# Patient Record
Sex: Female | Born: 1980 | Race: White | Hispanic: No | Marital: Married | Smoking: Former smoker
Health system: Southern US, Community
[De-identification: ages and names within clinical notes are randomized; demographics above are authoritative.]

## PROBLEM LIST (undated history)

## (undated) DIAGNOSIS — J309 Allergic rhinitis, unspecified: Secondary | ICD-10-CM

## (undated) DIAGNOSIS — J45909 Unspecified asthma, uncomplicated: Secondary | ICD-10-CM

## (undated) DIAGNOSIS — K219 Gastro-esophageal reflux disease without esophagitis: Secondary | ICD-10-CM

## (undated) HISTORY — PX: COLONOSCOPY: SHX174

## (undated) HISTORY — DX: Unspecified asthma, uncomplicated: J45.909

## (undated) HISTORY — PX: SINOSCOPY: SHX187

---

## 1999-03-22 ENCOUNTER — Other Ambulatory Visit: Admission: RE | Admit: 1999-03-22 | Discharge: 1999-03-22 | Payer: Self-pay | Admitting: Family Medicine

## 2000-04-02 ENCOUNTER — Other Ambulatory Visit: Admission: RE | Admit: 2000-04-02 | Discharge: 2000-04-02 | Payer: Self-pay | Admitting: Family Medicine

## 2001-04-09 ENCOUNTER — Other Ambulatory Visit: Admission: RE | Admit: 2001-04-09 | Discharge: 2001-04-09 | Payer: Self-pay | Admitting: Family Medicine

## 2002-04-22 ENCOUNTER — Other Ambulatory Visit: Admission: RE | Admit: 2002-04-22 | Discharge: 2002-04-22 | Payer: Self-pay | Admitting: Family Medicine

## 2004-04-29 ENCOUNTER — Ambulatory Visit: Payer: Self-pay | Admitting: Family Medicine

## 2004-07-06 ENCOUNTER — Ambulatory Visit: Payer: Self-pay | Admitting: Family Medicine

## 2004-12-13 ENCOUNTER — Other Ambulatory Visit: Admission: RE | Admit: 2004-12-13 | Discharge: 2004-12-13 | Payer: Self-pay | Admitting: Obstetrics and Gynecology

## 2005-02-08 ENCOUNTER — Inpatient Hospital Stay (HOSPITAL_COMMUNITY): Admission: AD | Admit: 2005-02-08 | Discharge: 2005-02-08 | Payer: Self-pay | Admitting: Obstetrics and Gynecology

## 2005-02-13 ENCOUNTER — Inpatient Hospital Stay (HOSPITAL_COMMUNITY): Admission: AD | Admit: 2005-02-13 | Discharge: 2005-02-13 | Payer: Self-pay | Admitting: Obstetrics and Gynecology

## 2005-02-16 ENCOUNTER — Inpatient Hospital Stay (HOSPITAL_COMMUNITY): Admission: AD | Admit: 2005-02-16 | Discharge: 2005-02-18 | Payer: Self-pay | Admitting: Obstetrics and Gynecology

## 2005-03-23 ENCOUNTER — Other Ambulatory Visit: Admission: RE | Admit: 2005-03-23 | Discharge: 2005-03-23 | Payer: Self-pay | Admitting: Obstetrics and Gynecology

## 2007-06-06 ENCOUNTER — Inpatient Hospital Stay (HOSPITAL_COMMUNITY): Admission: AD | Admit: 2007-06-06 | Discharge: 2007-06-09 | Payer: Self-pay | Admitting: Obstetrics and Gynecology

## 2007-06-06 ENCOUNTER — Inpatient Hospital Stay (HOSPITAL_COMMUNITY): Admission: AD | Admit: 2007-06-06 | Discharge: 2007-06-06 | Payer: Self-pay | Admitting: Obstetrics and Gynecology

## 2007-07-22 IMAGING — US US FETAL BPP W/O NONSTRESS
1 series · 18 of 19 positions shown · non-contrast
Comparison: none

CLINICAL DATA: 36 week 3 day assigned gestational age.  Pregnancy induced hypertension.  Nausea and vomiting.  Evaluate biophysical profile.

[Series 1: us fetal bpp w/o nonstress · non-contrast · 18 of 19 slices shown]
[im 1/19]
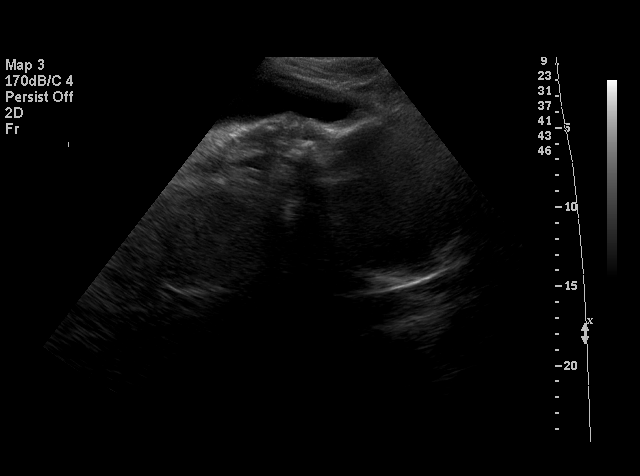
[im 2/19]
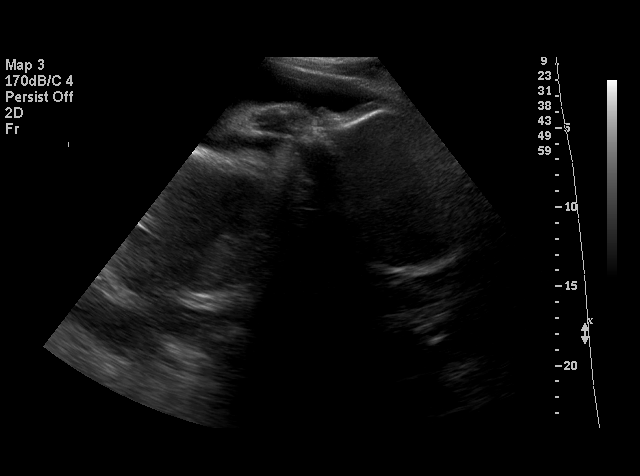
[im 3/19]
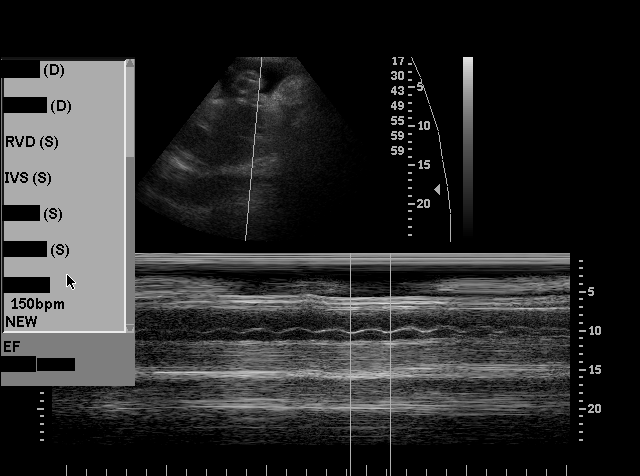
[im 4/19]
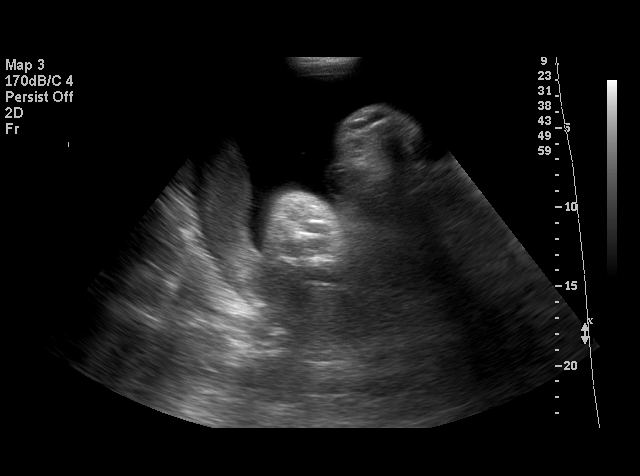
[im 5/19]
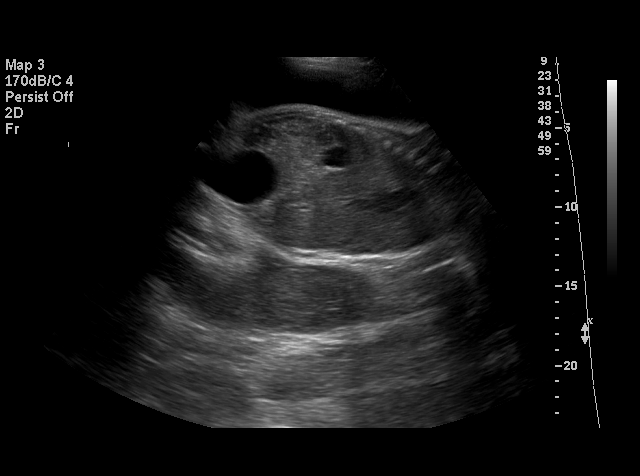
[im 6/19]
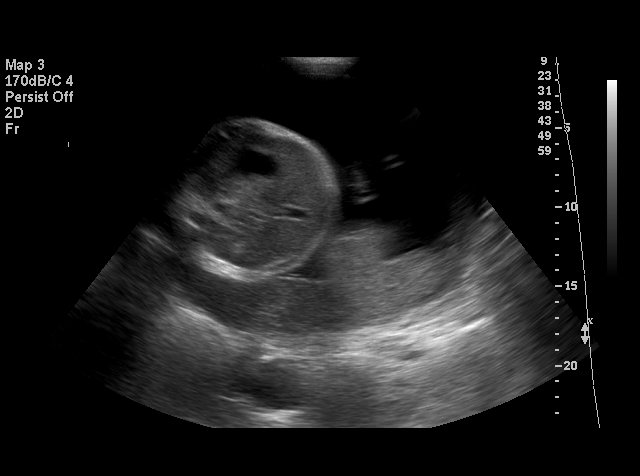
[im 7/19]
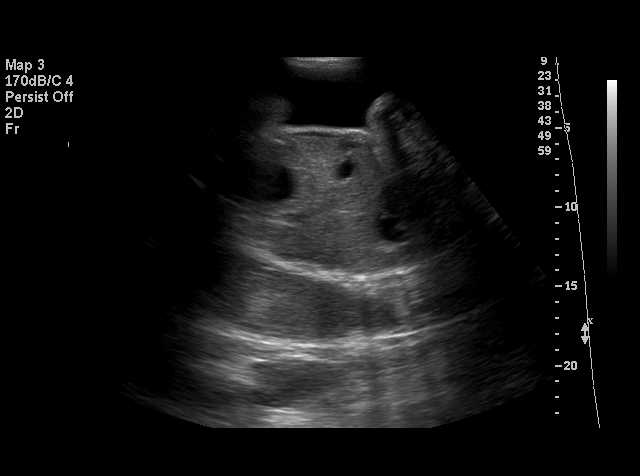
[im 8/19]
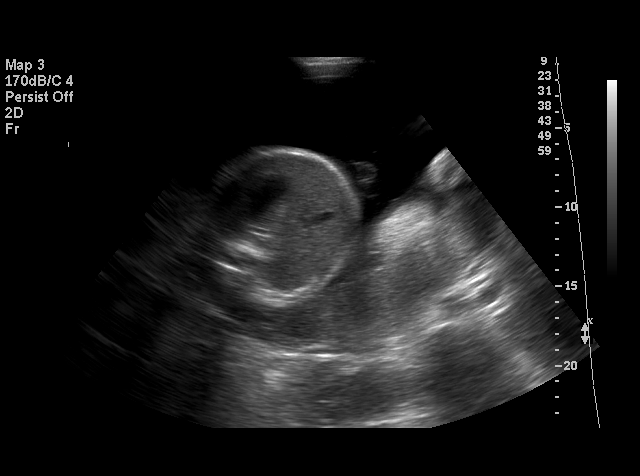
[im 9/19]
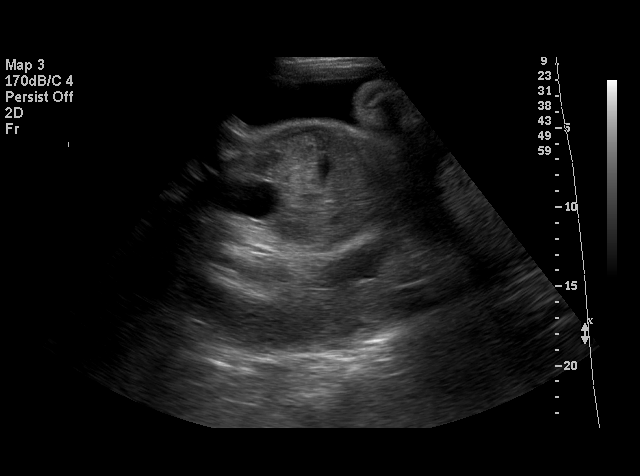
[im 11/19]
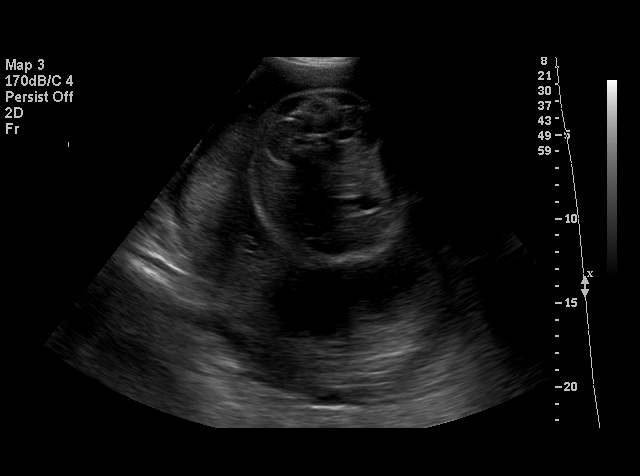
[im 12/19]
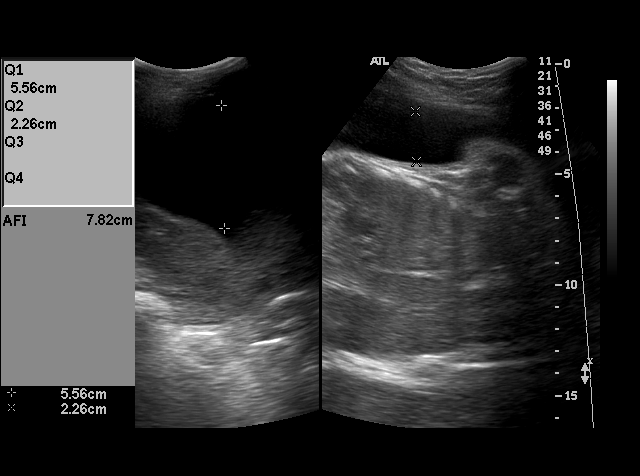
[im 13/19]
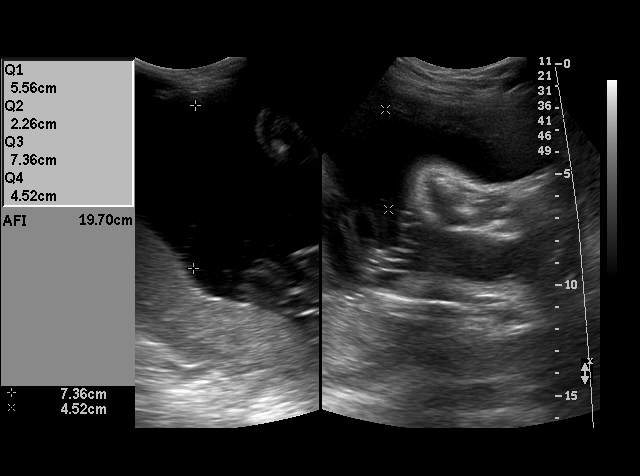
[im 14/19]
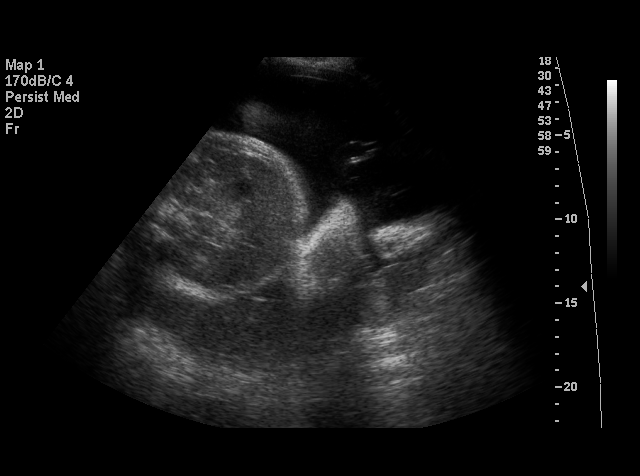
[im 15/19]
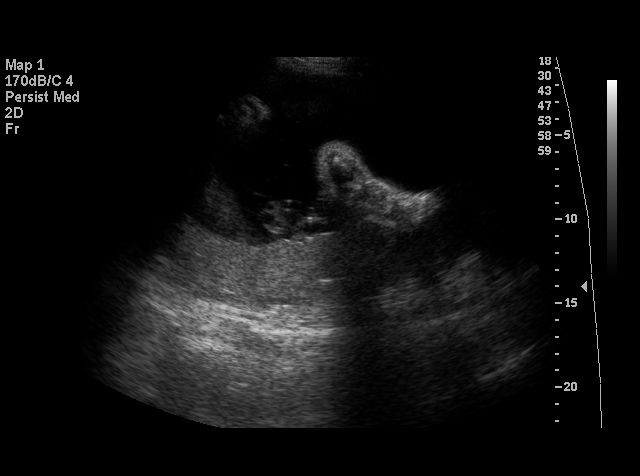
[im 16/19]
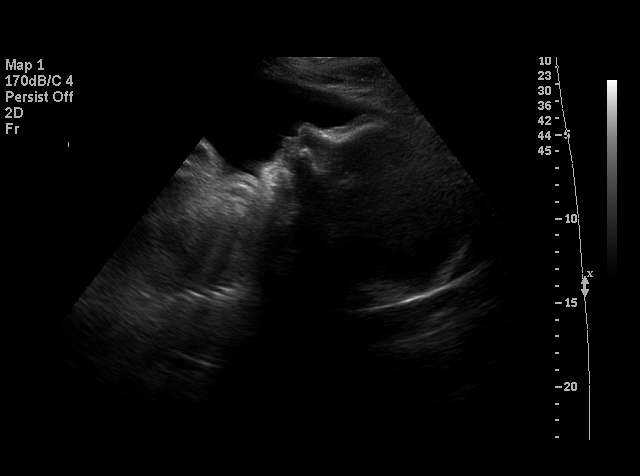
[im 17/19]
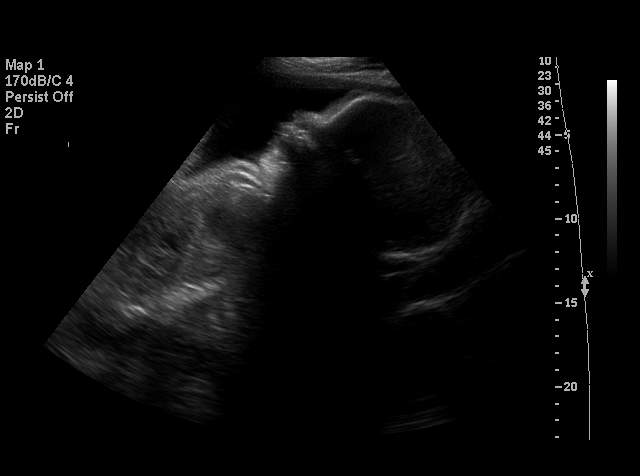
[im 18/19]
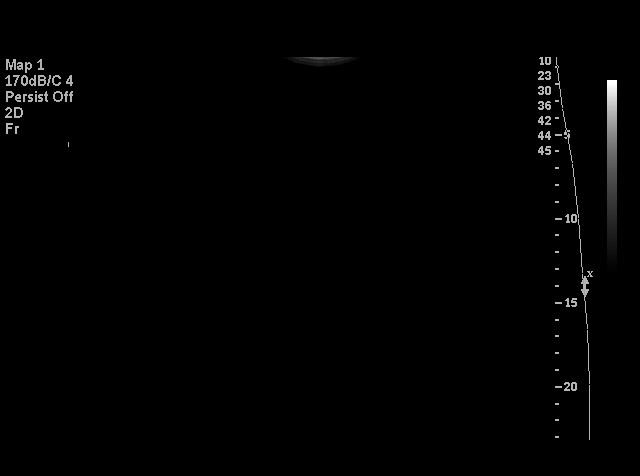
[im 19/19]
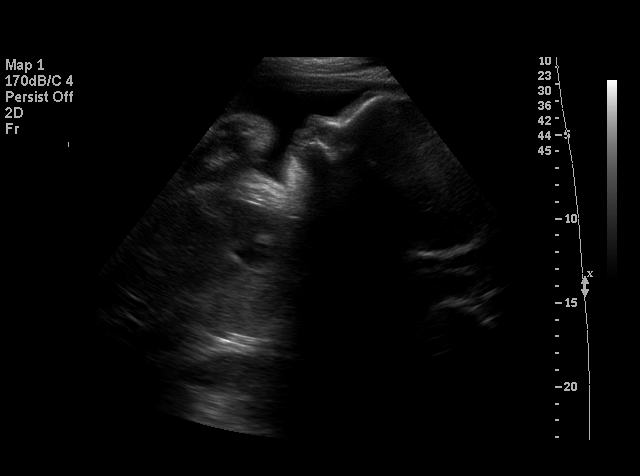

[18 of 19 positions shown; findings below may reference images not displayed]

BIOPHYSICAL PROFILE

 Number of Fetuses:  1
 Heart rate: 150
 Movement:  Yes
 Breathing:  Yes
 Presentation:  Cephalic
 Placental Location:  Posterior
 Grade:  I
 Previa:  No
 Amniotic Fluid (Subjective):  Upper normal
 Amniotic Fluid (Objective):  19.7 cm AFI (5th -95th%ile = 7.7 ? 24.9 cm for 36 wks)

 Fetal measurements and complete anatomic evaluation were not requested.  The following fetal anatomy was visualized on this exam: Stomach, kidneys, and bladder.

 BPP SCORING
 Movements:  2  Time:  6 minutes
 Breathing:  2 
 Tone:  2
 Amniotic Fluid:  2
 Total Score:  8

 MATERNAL UTERINE AND ADNEXAL FINDINGS
 Cervix:  Not evaluated
IMPRESSION: Single living intrauterine fetus in cephalic presentation.  Biophysical profile score [DATE].

## 2010-12-05 LAB — CBC
HCT: 39
HCT: 41.6
Hemoglobin: 13.6
Hemoglobin: 14.5
MCHC: 34.9
MCHC: 35
MCV: 91.8
MCV: 92.4
Platelets: 250
Platelets: 300
RBC: 4.25
RBC: 4.5
RDW: 12.6
RDW: 13.1
WBC: 11.9 — ABNORMAL HIGH
WBC: 12.2 — ABNORMAL HIGH

## 2010-12-05 LAB — RPR: RPR Ser Ql: NONREACTIVE

## 2014-11-19 DIAGNOSIS — J45909 Unspecified asthma, uncomplicated: Secondary | ICD-10-CM | POA: Insufficient documentation

## 2014-11-19 DIAGNOSIS — J309 Allergic rhinitis, unspecified: Secondary | ICD-10-CM | POA: Insufficient documentation

## 2014-11-19 DIAGNOSIS — K219 Gastro-esophageal reflux disease without esophagitis: Secondary | ICD-10-CM | POA: Insufficient documentation

## 2015-02-03 ENCOUNTER — Encounter: Payer: Self-pay | Admitting: Allergy and Immunology

## 2015-02-03 ENCOUNTER — Ambulatory Visit (INDEPENDENT_AMBULATORY_CARE_PROVIDER_SITE_OTHER): Payer: PRIVATE HEALTH INSURANCE | Admitting: Allergy and Immunology

## 2015-02-03 VITALS — BP 120/82 | HR 100 | Resp 16 | Ht 62.21 in | Wt 142.2 lb

## 2015-02-03 DIAGNOSIS — J4551 Severe persistent asthma with (acute) exacerbation: Secondary | ICD-10-CM | POA: Diagnosis not present

## 2015-02-03 DIAGNOSIS — J3089 Other allergic rhinitis: Secondary | ICD-10-CM

## 2015-02-03 DIAGNOSIS — K219 Gastro-esophageal reflux disease without esophagitis: Secondary | ICD-10-CM

## 2015-02-03 NOTE — Progress Notes (Signed)
[facility]  Follow-up Note  Refering Provider: No ref. provider found Primary Provider: Madaline Guthrie  Subjective:   Leslie Wheeler is a 34 y.o. female who [facility] the [facility] in re-evaluation of the following:  HPI Comments:  Leslie Wheeler returns to this clinic on 02/03/2015 in evaluation of her asthma and allergic rhinitis and reflux. She is really been doing very well up until possibly 3 weeks ago. That point time she started to develop problems with cough. As well, the course the past week she's developed stuffy nose and pressure and some green nasal discharge and ear pressure and she's using some over-the-counter phenylephrine. It should be noted that she was doing quite well and she felt that she no longer needed to use either Qvar or Advair. She did have instructions to slowly taper off to Qvar and remain on Advair but for some reason Advair was also discontinued. She has had use of bronchodilator recently. Her reflux is okay as long she remains away from all caffeine though she does have what sounds like a cup of tea in the morning and as long she continues to use her omeprazole 20 milk grams twice a day. Her nose is not really been causing her any problem. She consisted uses montelukast and Flonase.   Outpatient Encounter Prescriptions as of 02/03/2015  Medication Sig  . albuterol (PROVENTIL HFA;VENTOLIN HFA) 108 (90 BASE) MCG/ACT inhaler Inhale 2 puffs into the lungs every 6 (six) hours as needed for wheezing or shortness of breath.  . fluticasone (FLONASE) 50 MCG/ACT nasal spray Place 1 spray into both nostrils 2 (two) times daily.  Marland Kitchen loratadine (CLARITIN) 10 MG tablet Take 10 mg by mouth daily as needed for allergies.  . montelukast (SINGULAIR) 10 MG tablet Take 10 mg by mouth daily.  Marland Kitchen omeprazole (PRILOSEC) 20 MG capsule Take 20 mg by mouth daily.  . beclomethasone (QVAR) 80 MCG/ACT inhaler Inhale 2  puffs into the lungs 2 (two) times daily.  . fluticasone-salmeterol (ADVAIR HFA) 230-21 MCG/ACT inhaler Inhale 2 puffs into the lungs 2 (two) times daily.   No facility-administered encounter medications on file as of 02/03/2015.    No orders of the defined types were placed in this encounter.    Past Medical History  Diagnosis Date  . Asthma     Past Surgical History  Procedure Laterality Date  . Sinoscopy      Allergies  Allergen Reactions  . Aspirin   . Bactrim [Sulfamethoxazole-Trimethoprim]   . Cephalosporins   . Flagyl [Metronidazole]   . Latex   . Nsaids   . Penicillins   . Sulfa Antibiotics     Review of Systems  Constitutional: Negative.   HENT: Positive for congestion, sinus pressure and sneezing.   Respiratory: Positive for choking and wheezing.   Cardiovascular: Negative.   Gastrointestinal: Negative.   Musculoskeletal: Negative.   Skin: Negative.      Objective:   Filed Vitals:   02/03/15 1636  BP: 120/82  Pulse: 100  Resp: 16   Height: 5' 2.21" (158 cm)  Weight: 142 lb 3.2 oz (64.5 kg)   Physical Exam  Constitutional: She appears well-developed and well-nourished. No distress.  Nasal voice and slight cough  HENT:  Head: Normocephalic and atraumatic. Head is without right periorbital erythema and without left periorbital erythema.  Right Ear: Tympanic membrane, external ear and ear canal normal. No drainage or tenderness. No foreign bodies. Tympanic  membrane is not injected, not scarred, not perforated, not erythematous, not retracted and not bulging. No middle ear effusion.  Left Ear: Tympanic membrane, external ear and ear canal normal. No drainage or tenderness. No foreign bodies. Tympanic membrane is not injected, not scarred, not perforated, not erythematous, not retracted and not bulging.  No middle ear effusion.  Nose: Mucosal edema present. No rhinorrhea, nose lacerations or sinus tenderness.  No foreign bodies.  Mouth/Throat:  Oropharynx is clear and moist. No oropharyngeal exudate, posterior oropharyngeal edema, posterior oropharyngeal erythema or tonsillar abscesses.  Eyes: Lids are normal. Right eye exhibits no chemosis, no discharge and no exudate. No foreign body present in the right eye. Left eye exhibits no chemosis, no discharge and no exudate. No foreign body present in the left eye. Right conjunctiva is not injected. Left conjunctiva is not injected.  Neck: Neck supple. No tracheal tenderness present. No tracheal deviation and no edema present. No thyroid mass and no thyromegaly present.  Cardiovascular: Normal rate, regular rhythm, S1 normal and S2 normal.  Exam reveals no gallop.   No murmur heard. Pulmonary/Chest: No accessory muscle usage or stridor. No respiratory distress. She has no wheezes. She has no rhonchi. She has no rales.  Abdominal: Soft.  Lymphadenopathy:       Head (right side): No tonsillar adenopathy present.       Head (left side): No tonsillar adenopathy present.    She has no cervical adenopathy.  Neurological: She is alert.  Skin: No rash noted. She is not diaphoretic.  Psychiatric: She has a normal mood and affect. Her behavior is normal.    Diagnostics:    Spirometry was performed and demonstrated an FEV1 of 2.96 at 102 % of predicted.  The patient had an Asthma Control Test with the following results: ACT Total Score: 17.    Assessment and Plan:   1. Asthma, severe persistent, with acute exacerbation   2. Other allergic rhinitis   3. Gastroesophageal reflux disease, esophagitis presence not specified      1. Advair 230 two inhalations two times a day   2. Add Qvar 80 2 inhalations twice a day to Advair during "flareup"  3. Continue montelukast 10 mg daily  4. Continue Flonase one or 2 sprays each nostril daily  5. Continue ProAir HFA and Claritin if needed  6. Continue omeprazole 20 mg twice a day  7. Use nasal saline multiple times a day while sick  8.  Antibiotics? - Maybe next week  9. Return to clinic in 3 months or earlier if problem.  I'm going to have carried consistently use Advair 2:30 and we can restart that today and she can always adding Qvar 80 during a flareup. I will assume that she has a viral respiratory tract infection and hold off on any antibiotics at this point in time but certainly she remains symptomatic as we enter into next week she can contact me for further evaluation and treatment. If she does well see her back in this clinic in 3 months or earlier if there is a problem.   Laurette SchimkeEric Kozlow, MD [facility]

## 2015-02-03 NOTE — Patient Instructions (Signed)
  1. Advair 230 two inhalations two times a day   2. Add Qvar 80 2 inhalations twice a day to Advair during "flareup"  3. Continue montelukast 10 mg daily  4. Continue Flonase one or 2 sprays each nostril daily  5. Continue ProAir HFA and Claritin if needed  6. Continue omeprazole 20 mg twice a day  7. Use nasal saline multiple times a day while sick  8. Antibiotics? - Maybe next week  9. Return to clinic in 3 months or earlier if problem.

## 2015-04-16 ENCOUNTER — Other Ambulatory Visit: Payer: Self-pay

## 2015-04-16 MED ORDER — MONTELUKAST SODIUM 10 MG PO TABS: 10.0000 mg | ORAL_TABLET | Freq: Every day | ORAL | Status: DC

## 2015-05-06 ENCOUNTER — Ambulatory Visit (INDEPENDENT_AMBULATORY_CARE_PROVIDER_SITE_OTHER): Payer: PRIVATE HEALTH INSURANCE | Admitting: Allergy and Immunology

## 2015-05-06 ENCOUNTER — Encounter: Payer: Self-pay | Admitting: Allergy and Immunology

## 2015-05-06 VITALS — BP 100/62 | HR 64 | Resp 16

## 2015-05-06 DIAGNOSIS — K219 Gastro-esophageal reflux disease without esophagitis: Secondary | ICD-10-CM

## 2015-05-06 DIAGNOSIS — J4540 Moderate persistent asthma, uncomplicated: Secondary | ICD-10-CM

## 2015-05-06 DIAGNOSIS — J3089 Other allergic rhinitis: Secondary | ICD-10-CM | POA: Diagnosis not present

## 2015-05-06 NOTE — Progress Notes (Signed)
Follow-up Note  Referring Provider: Kyla Balzarine, PA-C Primary Provider: Madaline Guthrie Date of Office Visit: 05/06/2015  Subjective:   Leslie Wheeler (DOB: 1980/06/03) is a 35 y.o. female who [facility] the [facility] on 05/06/2015 in re-evaluation of the following:  HPI Comments: Leslie Wheeler returns to this clinic in reevaluation of her asthma and allergic rhinitis and reflux. She is done very well over the course the past 3 months with no exacerbations of her asthma requiring a systemic steroid, minimal use of a short acting bronchodilator, and no exercise-induced bronchospastic symptoms and her nose is been doing very well without any episodes of sinusitis. She continues to use her Advair and montelukast and nasal steroid on a consistent basis. Her reflexes been under good control and she relies on the use of omeprazole 20 mg once a day for the most part at this point but occasionally will go up to twice a day. She is now stopped consuming any chocolate and coffee but does have 1 tea per day.   Outpatient Prescriptions Prior to Visit  Medication Sig Dispense Refill  . albuterol (PROVENTIL HFA;VENTOLIN HFA) 108 (90 BASE) MCG/ACT inhaler Inhale 2 puffs into the lungs every 6 (six) hours as needed for wheezing or shortness of breath.    . beclomethasone (QVAR) 80 MCG/ACT inhaler Inhale 2 puffs into the lungs 2 (two) times daily.    . fluticasone (FLONASE) 50 MCG/ACT nasal spray Place 1 spray into both nostrils 2 (two) times daily.    . fluticasone-salmeterol (ADVAIR HFA) 230-21 MCG/ACT inhaler Inhale 2 puffs into the lungs 2 (two) times daily.    Marland Kitchen loratadine (CLARITIN) 10 MG tablet Take 10 mg by mouth daily as needed for allergies.    . montelukast (SINGULAIR) 10 MG tablet Take 1 tablet (10 mg total) by mouth daily. 30 tablet 3  . omeprazole (PRILOSEC) 20 MG capsule Take 20 mg by mouth daily.    . montelukast (SINGULAIR) 10 MG tablet Take 10 mg by mouth daily.      No facility-administered medications prior to visit.    No orders of the defined types were placed in this encounter.    Past Medical History  Diagnosis Date  . Asthma     Past Surgical History  Procedure Laterality Date  . Sinoscopy      Allergies  Allergen Reactions  . Aspirin   . Bactrim [Sulfamethoxazole-Trimethoprim]   . Cephalosporins   . Flagyl [Metronidazole]   . Latex   . Nsaids   . Penicillins   . Sulfa Antibiotics     Review of systems negative except as noted in HPI / PMHx or noted below:  Review of Systems  Constitutional: Negative.   HENT: Negative.   Eyes: Negative.   Respiratory: Negative.   Cardiovascular: Negative.   Gastrointestinal: Negative.   Genitourinary: Negative.   Musculoskeletal: Negative.   Skin: Negative.   Neurological: Negative.   Endo/Heme/Allergies: Negative.   Psychiatric/Behavioral: Negative.      Objective:   Filed Vitals:   05/06/15 0837  BP: 100/62  Pulse: 64  Resp: 16          Physical Exam  Constitutional: She is well-developed, well-nourished, and in no distress.  HENT:  Head: Normocephalic.  Right Ear: Tympanic membrane, external ear and ear canal normal.  Left Ear: Tympanic membrane, external ear and ear canal normal.  Nose: Nose normal. No mucosal edema or rhinorrhea.  Mouth/Throat: Uvula is midline, oropharynx is clear and  moist and mucous membranes are normal. No oropharyngeal exudate.  Eyes: Conjunctivae are normal.  Neck: Trachea normal. No tracheal tenderness present. No tracheal deviation present. No thyromegaly present.  Cardiovascular: Normal rate, regular rhythm, S1 normal, S2 normal and normal heart sounds.   No murmur heard. Pulmonary/Chest: Breath sounds normal. No stridor. No respiratory distress. She has no wheezes. She has no rales.  Musculoskeletal: She exhibits no edema.  Lymphadenopathy:       Head (right side): No tonsillar adenopathy present.       Head (left side): No  tonsillar adenopathy present.    She has no cervical adenopathy.    She has no axillary adenopathy.  Neurological: She is alert. Gait normal.  Skin: No rash noted. She is not diaphoretic. No erythema. Nails show no clubbing.  Psychiatric: Mood and affect normal.    Diagnostics:    Spirometry was performed and demonstrated an FEV1 of 3.0 at 103 % of predicted.  The patient had an Asthma Control Test with the following results:  .    Assessment and Plan:   No diagnosis found.  1. Decrease Advair 115 two inhalations two times a day   2. Add Qvar 80 2 inhalations twice a day to Advair during "flareup"  3. Continue montelukast 10 mg daily  4. Continue Flonase one or 2 sprays each nostril daily  5. Continue ProAir HFA and Claritin if needed  6. Continue omeprazole 20 mg twice a day  8. Return to clinic in 6 months or earlier if problem.  Leslie Wheeler has done very well on her current medical therapy and we'll now see we can consolidate her treatment by decreasing the amount of inhaled steroid she utilizes per day having her go from Advair 230 g twice a day to 115 g twice a day. She has a very good understanding of her disease state and how the medications work and she knows how to appropriately use these medications. We'll now see her back in this clinic in approximately 6 months or earlier if there is a problem.  Laurette Schimke, MD [facility]

## 2015-05-06 NOTE — Patient Instructions (Signed)
  1. Decrease Advair 115 two inhalations two times a day   2. Add Qvar 80 2 inhalations twice a day to Advair during "flareup"  3. Continue montelukast 10 mg daily  4. Continue Flonase one or 2 sprays each nostril daily  5. Continue ProAir HFA and Claritin if needed  6. Continue omeprazole 20 mg twice a day  8. Return to clinic in 6 months or earlier if problem.

## 2015-05-07 MED ORDER — MONTELUKAST SODIUM 10 MG PO TABS: 10.0000 mg | ORAL_TABLET | Freq: Every day | ORAL | Status: AC

## 2015-08-18 ENCOUNTER — Other Ambulatory Visit: Payer: Self-pay | Admitting: Allergy and Immunology

## 2015-08-18 MED ORDER — FLUTICASONE-SALMETEROL 115-21 MCG/ACT IN AERO: 2.0000 | INHALATION_SPRAY | Freq: Two times a day (BID) | RESPIRATORY_TRACT | Status: DC

## 2015-08-18 NOTE — Telephone Encounter (Signed)
Leslie Wheeler wants to fill the RX for Advair. She said it is working good.  [facility].

## 2015-08-18 NOTE — Telephone Encounter (Signed)
RX SENT FOR ADVAIR 115 TO [facility]

## 2015-11-04 ENCOUNTER — Ambulatory Visit: Payer: PRIVATE HEALTH INSURANCE | Admitting: Allergy and Immunology

## 2017-06-19 DIAGNOSIS — R5383 Other fatigue: Secondary | ICD-10-CM | POA: Diagnosis not present

## 2017-06-19 DIAGNOSIS — Z780 Asymptomatic menopausal state: Secondary | ICD-10-CM | POA: Diagnosis not present

## 2017-06-19 DIAGNOSIS — Z8049 Family history of malignant neoplasm of other genital organs: Secondary | ICD-10-CM | POA: Diagnosis not present

## 2017-06-19 DIAGNOSIS — Z01419 Encounter for gynecological examination (general) (routine) without abnormal findings: Secondary | ICD-10-CM | POA: Diagnosis not present

## 2017-06-19 DIAGNOSIS — Z809 Family history of malignant neoplasm, unspecified: Secondary | ICD-10-CM | POA: Diagnosis not present

## 2017-06-19 DIAGNOSIS — Z803 Family history of malignant neoplasm of breast: Secondary | ICD-10-CM | POA: Diagnosis not present

## 2017-06-19 DIAGNOSIS — Z8371 Family history of colonic polyps: Secondary | ICD-10-CM | POA: Diagnosis not present

## 2017-06-19 DIAGNOSIS — Z801 Family history of malignant neoplasm of trachea, bronchus and lung: Secondary | ICD-10-CM | POA: Diagnosis not present

## 2017-06-19 DIAGNOSIS — Z8041 Family history of malignant neoplasm of ovary: Secondary | ICD-10-CM | POA: Diagnosis not present

## 2017-06-19 DIAGNOSIS — Z6827 Body mass index (BMI) 27.0-27.9, adult: Secondary | ICD-10-CM | POA: Diagnosis not present

## 2017-06-21 DIAGNOSIS — Z30433 Encounter for removal and reinsertion of intrauterine contraceptive device: Secondary | ICD-10-CM | POA: Diagnosis not present

## 2017-08-01 DIAGNOSIS — Z809 Family history of malignant neoplasm, unspecified: Secondary | ICD-10-CM | POA: Diagnosis not present

## 2017-08-01 DIAGNOSIS — J019 Acute sinusitis, unspecified: Secondary | ICD-10-CM | POA: Diagnosis not present

## 2017-08-01 DIAGNOSIS — N939 Abnormal uterine and vaginal bleeding, unspecified: Secondary | ICD-10-CM | POA: Diagnosis not present

## 2017-09-17 DIAGNOSIS — S6392XA Sprain of unspecified part of left wrist and hand, initial encounter: Secondary | ICD-10-CM | POA: Diagnosis not present

## 2017-09-19 DIAGNOSIS — M25632 Stiffness of left wrist, not elsewhere classified: Secondary | ICD-10-CM | POA: Diagnosis not present

## 2017-09-19 DIAGNOSIS — M25532 Pain in left wrist: Secondary | ICD-10-CM | POA: Diagnosis not present

## 2017-09-26 DIAGNOSIS — M25532 Pain in left wrist: Secondary | ICD-10-CM | POA: Diagnosis not present

## 2017-09-26 DIAGNOSIS — R52 Pain, unspecified: Secondary | ICD-10-CM | POA: Diagnosis not present

## 2017-10-24 DIAGNOSIS — R52 Pain, unspecified: Secondary | ICD-10-CM | POA: Diagnosis not present

## 2017-10-24 DIAGNOSIS — M25532 Pain in left wrist: Secondary | ICD-10-CM | POA: Diagnosis not present

## 2017-10-29 DIAGNOSIS — M25632 Stiffness of left wrist, not elsewhere classified: Secondary | ICD-10-CM | POA: Diagnosis not present

## 2017-10-29 DIAGNOSIS — M25532 Pain in left wrist: Secondary | ICD-10-CM | POA: Diagnosis not present

## 2017-11-02 DIAGNOSIS — K5901 Slow transit constipation: Secondary | ICD-10-CM | POA: Diagnosis not present

## 2017-11-02 DIAGNOSIS — K921 Melena: Secondary | ICD-10-CM | POA: Diagnosis not present

## 2017-11-02 DIAGNOSIS — Z8379 Family history of other diseases of the digestive system: Secondary | ICD-10-CM | POA: Diagnosis not present

## 2017-11-06 DIAGNOSIS — M25632 Stiffness of left wrist, not elsewhere classified: Secondary | ICD-10-CM | POA: Diagnosis not present

## 2017-11-06 DIAGNOSIS — M25532 Pain in left wrist: Secondary | ICD-10-CM | POA: Diagnosis not present

## 2017-11-16 DIAGNOSIS — R52 Pain, unspecified: Secondary | ICD-10-CM | POA: Diagnosis not present

## 2017-11-16 DIAGNOSIS — M25532 Pain in left wrist: Secondary | ICD-10-CM | POA: Diagnosis not present

## 2017-11-22 DIAGNOSIS — K620 Anal polyp: Secondary | ICD-10-CM | POA: Diagnosis not present

## 2017-11-22 DIAGNOSIS — K5900 Constipation, unspecified: Secondary | ICD-10-CM | POA: Diagnosis not present

## 2017-11-22 DIAGNOSIS — K921 Melena: Secondary | ICD-10-CM | POA: Diagnosis not present

## 2017-11-22 DIAGNOSIS — Z8371 Family history of colonic polyps: Secondary | ICD-10-CM | POA: Diagnosis not present

## 2017-11-22 DIAGNOSIS — K600 Acute anal fissure: Secondary | ICD-10-CM | POA: Diagnosis not present

## 2017-11-22 DIAGNOSIS — K625 Hemorrhage of anus and rectum: Secondary | ICD-10-CM | POA: Diagnosis not present

## 2017-12-17 DIAGNOSIS — R52 Pain, unspecified: Secondary | ICD-10-CM | POA: Diagnosis not present

## 2017-12-17 DIAGNOSIS — S6992XA Unspecified injury of left wrist, hand and finger(s), initial encounter: Secondary | ICD-10-CM | POA: Diagnosis not present

## 2017-12-31 DIAGNOSIS — S6992XA Unspecified injury of left wrist, hand and finger(s), initial encounter: Secondary | ICD-10-CM | POA: Diagnosis not present

## 2017-12-31 DIAGNOSIS — M25532 Pain in left wrist: Secondary | ICD-10-CM | POA: Diagnosis not present

## 2017-12-31 DIAGNOSIS — Y93B3 Activity, free weights: Secondary | ICD-10-CM | POA: Diagnosis not present

## 2017-12-31 DIAGNOSIS — X500XXA Overexertion from strenuous movement or load, initial encounter: Secondary | ICD-10-CM | POA: Diagnosis not present

## 2018-01-04 DIAGNOSIS — S6982XD Other specified injuries of left wrist, hand and finger(s), subsequent encounter: Secondary | ICD-10-CM | POA: Diagnosis not present

## 2018-01-07 ENCOUNTER — Other Ambulatory Visit: Payer: Self-pay | Admitting: Orthopedic Surgery

## 2018-01-29 ENCOUNTER — Other Ambulatory Visit: Payer: Self-pay

## 2018-01-29 ENCOUNTER — Encounter (HOSPITAL_BASED_OUTPATIENT_CLINIC_OR_DEPARTMENT_OTHER): Payer: Self-pay | Admitting: *Deleted

## 2018-02-05 ENCOUNTER — Encounter (HOSPITAL_BASED_OUTPATIENT_CLINIC_OR_DEPARTMENT_OTHER): Payer: Self-pay | Admitting: Certified Registered"

## 2018-02-05 ENCOUNTER — Ambulatory Visit (HOSPITAL_BASED_OUTPATIENT_CLINIC_OR_DEPARTMENT_OTHER)
Admission: RE | Admit: 2018-02-05 | Discharge: 2018-02-05 | Disposition: A | Discharge status: Home / Self Care | Payer: 59 | Source: Ambulatory Visit | Attending: Orthopedic Surgery | Admitting: Orthopedic Surgery

## 2018-02-05 ENCOUNTER — Encounter (HOSPITAL_BASED_OUTPATIENT_CLINIC_OR_DEPARTMENT_OTHER)
Admission: RE | Disposition: A | Discharge status: Home / Self Care | Payer: Self-pay | Source: Ambulatory Visit | Attending: Orthopedic Surgery

## 2018-02-05 ENCOUNTER — Ambulatory Visit (HOSPITAL_BASED_OUTPATIENT_CLINIC_OR_DEPARTMENT_OTHER): Payer: 59 | Admitting: Anesthesiology

## 2018-02-05 ENCOUNTER — Other Ambulatory Visit: Payer: Self-pay

## 2018-02-05 DIAGNOSIS — Z7951 Long term (current) use of inhaled steroids: Secondary | ICD-10-CM | POA: Diagnosis not present

## 2018-02-05 DIAGNOSIS — Z87891 Personal history of nicotine dependence: Secondary | ICD-10-CM | POA: Diagnosis not present

## 2018-02-05 DIAGNOSIS — J45909 Unspecified asthma, uncomplicated: Secondary | ICD-10-CM | POA: Diagnosis not present

## 2018-02-05 DIAGNOSIS — X58XXXA Exposure to other specified factors, initial encounter: Secondary | ICD-10-CM | POA: Insufficient documentation

## 2018-02-05 DIAGNOSIS — Y93B1 Activity, exercise machines primarily for muscle strengthening: Secondary | ICD-10-CM | POA: Diagnosis not present

## 2018-02-05 DIAGNOSIS — Z880 Allergy status to penicillin: Secondary | ICD-10-CM | POA: Diagnosis not present

## 2018-02-05 DIAGNOSIS — S6992XA Unspecified injury of left wrist, hand and finger(s), initial encounter: Secondary | ICD-10-CM | POA: Diagnosis present

## 2018-02-05 DIAGNOSIS — S63512A Sprain of carpal joint of left wrist, initial encounter: Secondary | ICD-10-CM | POA: Diagnosis not present

## 2018-02-05 DIAGNOSIS — Z79899 Other long term (current) drug therapy: Secondary | ICD-10-CM | POA: Diagnosis not present

## 2018-02-05 DIAGNOSIS — Z882 Allergy status to sulfonamides status: Secondary | ICD-10-CM | POA: Diagnosis not present

## 2018-02-05 DIAGNOSIS — Z881 Allergy status to other antibiotic agents status: Secondary | ICD-10-CM | POA: Insufficient documentation

## 2018-02-05 DIAGNOSIS — M25532 Pain in left wrist: Secondary | ICD-10-CM | POA: Diagnosis not present

## 2018-02-05 DIAGNOSIS — K219 Gastro-esophageal reflux disease without esophagitis: Secondary | ICD-10-CM | POA: Diagnosis not present

## 2018-02-05 DIAGNOSIS — Z886 Allergy status to analgesic agent status: Secondary | ICD-10-CM | POA: Insufficient documentation

## 2018-02-05 DIAGNOSIS — S63592A Other specified sprain of left wrist, initial encounter: Secondary | ICD-10-CM | POA: Diagnosis not present

## 2018-02-05 HISTORY — DX: Gastro-esophageal reflux disease without esophagitis: K21.9

## 2018-02-05 HISTORY — PX: WRIST ARTHROSCOPY: SHX838

## 2018-02-05 HISTORY — DX: Allergic rhinitis, unspecified: J30.9

## 2018-02-05 SURGERY — ARTHROSCOPY, WRIST
Anesthesia: Monitor Anesthesia Care | Site: Wrist | Laterality: Left

## 2018-02-05 MED ORDER — FENTANYL CITRATE (PF) 100 MCG/2ML IJ SOLN
INTRAMUSCULAR | Status: AC
  Filled 2018-02-05: qty 2

## 2018-02-05 MED ORDER — LACTATED RINGERS IV SOLN
INTRAVENOUS | Status: DC
  Administered 2018-02-05: 11:00:00 via INTRAVENOUS

## 2018-02-05 MED ORDER — HYDROCODONE-ACETAMINOPHEN 5-325 MG PO TABS: 1.0000 | ORAL_TABLET | Freq: Four times a day (QID) | ORAL | 0 refills | Status: AC | PRN

## 2018-02-05 MED ORDER — ACETAMINOPHEN 325 MG PO TABS: 325.0000 mg | ORAL_TABLET | ORAL | Status: DC | PRN

## 2018-02-05 MED ORDER — MIDAZOLAM HCL 2 MG/2ML IJ SOLN
INTRAMUSCULAR | Status: AC
  Filled 2018-02-05: qty 2

## 2018-02-05 MED ORDER — ONDANSETRON HCL 4 MG/2ML IJ SOLN: 4.0000 mg | Freq: Once | INTRAMUSCULAR | Status: DC | PRN

## 2018-02-05 MED ORDER — FENTANYL CITRATE (PF) 100 MCG/2ML IJ SOLN: 25.0000 ug | INTRAMUSCULAR | Status: DC | PRN

## 2018-02-05 MED ORDER — MIDAZOLAM HCL 2 MG/2ML IJ SOLN
1.0000 mg | INTRAMUSCULAR | Status: DC | PRN
  Administered 2018-02-05 (×2): 1 mg via INTRAVENOUS

## 2018-02-05 MED ORDER — ROPIVACAINE HCL 7.5 MG/ML IJ SOLN
INTRAMUSCULAR | Status: DC | PRN
  Administered 2018-02-05 (×4): 5 mL via PERINEURAL

## 2018-02-05 MED ORDER — VANCOMYCIN HCL IN DEXTROSE 1-5 GM/200ML-% IV SOLN
INTRAVENOUS | Status: AC
  Filled 2018-02-05: qty 200

## 2018-02-05 MED ORDER — OXYCODONE HCL 5 MG/5ML PO SOLN: 5.0000 mg | Freq: Once | ORAL | Status: DC | PRN

## 2018-02-05 MED ORDER — FENTANYL CITRATE (PF) 100 MCG/2ML IJ SOLN
50.0000 ug | INTRAMUSCULAR | Status: DC | PRN
  Administered 2018-02-05 (×2): 50 ug via INTRAVENOUS

## 2018-02-05 MED ORDER — OXYCODONE HCL 5 MG PO TABS: 5.0000 mg | ORAL_TABLET | Freq: Once | ORAL | Status: DC | PRN

## 2018-02-05 MED ORDER — SCOPOLAMINE 1 MG/3DAYS TD PT72: 1.0000 | MEDICATED_PATCH | Freq: Once | TRANSDERMAL | Status: DC | PRN

## 2018-02-05 MED ORDER — ACETAMINOPHEN 160 MG/5ML PO SOLN: 325.0000 mg | ORAL | Status: DC | PRN

## 2018-02-05 MED ORDER — VANCOMYCIN HCL IN DEXTROSE 1-5 GM/200ML-% IV SOLN
1000.0000 mg | INTRAVENOUS | Status: AC
  Administered 2018-02-05: 1000 mg via INTRAVENOUS

## 2018-02-05 MED ORDER — MEPERIDINE HCL 25 MG/ML IJ SOLN: 6.2500 mg | INTRAMUSCULAR | Status: DC | PRN

## 2018-02-05 MED ORDER — LIDOCAINE HCL (CARDIAC) PF 100 MG/5ML IV SOSY
PREFILLED_SYRINGE | INTRAVENOUS | Status: DC | PRN
  Administered 2018-02-05: 30 mg via INTRAVENOUS

## 2018-02-05 MED ORDER — PROPOFOL 500 MG/50ML IV EMUL
INTRAVENOUS | Status: DC | PRN
  Administered 2018-02-05: 100 ug/kg/min via INTRAVENOUS

## 2018-02-05 SURGICAL SUPPLY — 80 items
BLADE CUDA 2.0 (BLADE) IMPLANT
BLADE EAR TYMPAN 2.5 60D BEAV (BLADE) IMPLANT
BLADE MINI RND TIP GREEN BEAV (BLADE) IMPLANT
BLADE SURG 15 STRL LF DISP TIS (BLADE) ×1 IMPLANT
BLADE SURG 15 STRL SS (BLADE) ×3
BNDG CMPR 9X4 STRL LF SNTH (GAUZE/BANDAGES/DRESSINGS)
BNDG COHESIVE 3X5 TAN STRL LF (GAUZE/BANDAGES/DRESSINGS) ×3 IMPLANT
BNDG ESMARK 4X9 LF (GAUZE/BANDAGES/DRESSINGS) IMPLANT
BNDG GAUZE ELAST 4 BULKY (GAUZE/BANDAGES/DRESSINGS) ×3 IMPLANT
BUR CUDA 2.9 (BURR) IMPLANT
BUR CUDA 2.9MM (BURR)
BUR FULL RADIUS 2.0 (BURR) ×1 IMPLANT
BUR FULL RADIUS 2.0MM (BURR) ×1
BUR FULL RADIUS 2.9 (BURR) IMPLANT
BUR FULL RADIUS 2.9MM (BURR)
BUR GATOR 2.9 (BURR) ×1 IMPLANT
BUR GATOR 2.9MM (BURR) ×1
BUR SPHERICAL 2.9 (BURR) IMPLANT
BUR SPHERICAL 2.9MM (BURR)
CANISTER SUCT 1200ML W/VALVE (MISCELLANEOUS) IMPLANT
CHLORAPREP W/TINT 26ML (MISCELLANEOUS) ×3 IMPLANT
CORD BIPOLAR FORCEPS 12FT (ELECTRODE) IMPLANT
COVER BACK TABLE 60X90IN (DRAPES) ×3 IMPLANT
COVER MAYO STAND STRL (DRAPES) ×3 IMPLANT
COVER WAND RF STERILE (DRAPES) IMPLANT
CUFF TOURNIQUET SINGLE 18IN (TOURNIQUET CUFF) ×2 IMPLANT
DRAPE EXTREMITY T 121X128X90 (DRAPE) ×3 IMPLANT
DRAPE IMP U-DRAPE 54X76 (DRAPES) ×3 IMPLANT
DRAPE OEC MINIVIEW 54X84 (DRAPES) IMPLANT
DRAPE SURG 17X23 STRL (DRAPES) ×3 IMPLANT
ELECT SMALL JOINT 90D BASC (ELECTRODE) IMPLANT
GAUZE SPONGE 4X4 12PLY STRL (GAUZE/BANDAGES/DRESSINGS) ×3 IMPLANT
GAUZE XEROFORM 1X8 LF (GAUZE/BANDAGES/DRESSINGS) ×3 IMPLANT
GLOVE BIOGEL PI IND STRL 8 (GLOVE) IMPLANT
GLOVE BIOGEL PI IND STRL 8.5 (GLOVE) ×1 IMPLANT
GLOVE BIOGEL PI INDICATOR 8 (GLOVE) ×2
GLOVE BIOGEL PI INDICATOR 8.5 (GLOVE) ×2
GLOVE SURG SS PI 8.0 STRL IVOR (GLOVE) ×4 IMPLANT
GOWN STRL REUS W/ TWL LRG LVL3 (GOWN DISPOSABLE) ×1 IMPLANT
GOWN STRL REUS W/TWL LRG LVL3 (GOWN DISPOSABLE) ×3
GOWN STRL REUS W/TWL XL LVL3 (GOWN DISPOSABLE) ×3 IMPLANT
IV NS IRRIG 3000ML ARTHROMATIC (IV SOLUTION) ×3 IMPLANT
IV SET EXT 30 76VOL 4 MALE LL (IV SETS) ×3 IMPLANT
NDL EPIDURAL TUOHY 20GX3.5 (NEEDLE) IMPLANT
NDL SAFETY ECLIPSE 18X1.5 (NEEDLE) ×3 IMPLANT
NDL SPNL 18GX3.5 QUINCKE PK (NEEDLE) IMPLANT
NEEDLE HYPO 18GX1.5 SHARP (NEEDLE) ×3
NEEDLE HYPO 22GX1.5 SAFETY (NEEDLE) ×3 IMPLANT
NEEDLE SPNL 18GX3.5 QUINCKE PK (NEEDLE) IMPLANT
NEEDLE TUOHY 20GX3.5 (NEEDLE) IMPLANT
NS IRRIG 1000ML POUR BTL (IV SOLUTION) IMPLANT
PACK BASIN DAY SURGERY FS (CUSTOM PROCEDURE TRAY) ×3 IMPLANT
PAD CAST 3X4 CTTN HI CHSV (CAST SUPPLIES) ×1 IMPLANT
PADDING CAST ABS 3INX4YD NS (CAST SUPPLIES) ×2
PADDING CAST ABS 4INX4YD NS (CAST SUPPLIES) ×2
PADDING CAST ABS COTTON 3X4 (CAST SUPPLIES) ×1 IMPLANT
PADDING CAST ABS COTTON 4X4 ST (CAST SUPPLIES) ×1 IMPLANT
PADDING CAST COTTON 3X4 STRL (CAST SUPPLIES) ×3
ROUTER HOODED VORTEX 2.9MM (BLADE) IMPLANT
SET SM JOINT TUBING/CANN (CANNULA) IMPLANT
SLEEVE SCD COMPRESS KNEE MED (MISCELLANEOUS) IMPLANT
SLING ARM FOAM STRAP LRG (SOFTGOODS) ×2 IMPLANT
SPLINT PLASTER CAST XFAST 3X15 (CAST SUPPLIES) IMPLANT
SPLINT PLASTER XTRA FASTSET 3X (CAST SUPPLIES)
STOCKINETTE 4X48 STRL (DRAPES) ×3 IMPLANT
SUCTION FRAZIER HANDLE 10FR (MISCELLANEOUS)
SUCTION TUBE FRAZIER 10FR DISP (MISCELLANEOUS) IMPLANT
SUT MERSILENE 4 0 P 3 (SUTURE) IMPLANT
SUT PDS AB 2-0 CT2 27 (SUTURE) IMPLANT
SUT STEEL 4 0 (SUTURE) IMPLANT
SUT VIC AB 2-0 PS2 27 (SUTURE) IMPLANT
SUT VICRYL 4-0 PS2 18IN ABS (SUTURE) IMPLANT
SYR BULB 3OZ (MISCELLANEOUS) ×3 IMPLANT
SYR CONTROL 10ML LL (SYRINGE) ×3 IMPLANT
TUBE CONNECTING 20'X1/4 (TUBING) ×1
TUBE CONNECTING 20X1/4 (TUBING) ×1 IMPLANT
TUBING ARTHRO INFLOW-ONLY STRL (TUBING) ×2 IMPLANT
UNDERPAD 30X30 (UNDERPADS AND DIAPERS) ×3 IMPLANT
WAND SHORT BEVEL W/CORD (SURGICAL WAND) ×2 IMPLANT
WATER STERILE IRR 1000ML POUR (IV SOLUTION) ×3 IMPLANT

## 2018-02-05 NOTE — Op Note (Signed)
NAME: Leslie MaduroCarrie A Medearis MEDICAL RECORD NO: [id number] DATE OF BIRTH: 1980/07/04 [facility] PHYSICIAN: Nicki ReaperGARY R. Sharry Beining, MD   OPERATIVE REPORT   DATE OF PROCEDURE: 02/05/18    PREOPERATIVE DIAGNOSIS:   Left wrist pain   POSTOPERATIVE DIAGNOSIS:   Scapholunate lunotriquetral ligament tear   PROCEDURE:   Arthroscopy with debridement of scapholunate lunotriquetral ligament tear left wrist   SURGEON: Cindee SaltGary Edwardo Wojnarowski, M.D.   ASSISTANT: none   ANESTHESIA:  General with regional   INTRAVENOUS FLUIDS:  Per anesthesia flow sheet.   ESTIMATED BLOOD LOSS:  Minimal.   COMPLICATIONS:  None.   SPECIMENS:  none   TOURNIQUET TIME:   * Missing tourniquet times found for documented tourniquets in log: [id number] *   DISPOSITION:  Stable to PACU.   INDICATIONS: Patient is a 37 year old female who sustained an injury to her left wrist in a workout station.  She has had progressive wrist pain not responsive to conservative treatment.  MRI arthrogram reveals that she has contrast agent in the radiocarpal midcarpal joint and some along the ulnar aspect of her wrist.  She is elected to undergo wrist arthroscopy with debridement repair as dictated by findings.  Pre-peri-and postoperative course been discussed along with risk complications.  She is aware that there is no guarantee to the surgery the possibility of infection recurrence injury to arteries nerves tendons and complete release symptoms dystrophy the possibility of finding greater injury than indicated by the MRI.  Is seen in the preoperative area.  The extremity marked by both patient and surgeon a supraclavicular block was carried out by the anesthesia department.  OPERATIVE COURSE: The patient is brought to the operating room where she is placed in supine position with the left arm free.  General anesthetic was also given.  She was prepped using ChloraPrep and a three-minute dry time taken to timeout was taken  to confirm patient procedure.  The left limb was placed in the arc arthroscopy tower 10 pounds traction applied the joint inflated through the 3-4 portal.  Veins worse incision was made deepened with a hemostat blunt trocar was inserted to enter the joint the joint was inspected immediate scapholunate ligament complex tear was noted.  The volar radial wrist ligaments were intact there was no significant degenerative changes on the proximal carpal row distal radius.  The scope was brought ulnarly.  The ulnar volar ligaments appear to be intact there was a lunotriquetral ligament tear.  A 18-gauge needle was placed in the 6 you portal the triangle fibrocartilage complex was intact there was significant scarring and fraying of the dorsal capsule.  A 4-5 portal was opened after localization with a 22-gauge needle a transverse incision made deepened with a hemostat blunt trocar used to enter the joint.  The joint was inspected from the ulnar side.  Lunotriquetral tear was immediately apparent both tears and large flaps of the ligament still attached floating in the joint.  The scope was reintroduced into the 3-4 portal.  A probe was placed in the 4 5000 normal trampoline effect was present to the TFCC.  A 2 mm full-radius shaver was then introduced the joint was debrided of synovitis which was present over the entire dorsal aspect.  There was no gross tear of the dorsal capsule.  The scope was introduced in the 4-5 portal and the TFCC was inspected found to be intact dorsally palmarly.  The scope was brought radially.  A full-radius shaver was then passed  into a significant flap tear of the scapholunate ligament off the lunate was noted.  This was debrided with a full-radius shaver Gator shaver.  This appeared to involve the entire ligament.  The scope was reintroduced in the 384 portal the shavers and the 4 5 and a debridement of the lunotriquetral tear was then performed.  The scope was able to be introduced into the  scapholunate ligament tear and able to visualize the capitate proximally.  Midcarpal joint ulnar portal was then opened after localization with a 22-gauge needle needle and a transverse incision made deepened with a hemostat the scope was introduced ulnarly the cartilage showed no significant changes but instability of both scapholunate lunotriquetral ligament complexes were as noted.  Was decided at this point in time no further arthroscopic repairs could be performed.  The scope and shavers were removed.  The portals closed with interrupted 4 nylon sutures.  A sterile compressive dressing volar splint was applied.  The patient was taken to the recovery room for observation in satisfactory condition.  She will be discharged home to return to [facility] [place] in 1 week she will take Tylenol ibuprofen but has Norco for breakthrough.  Cindee Salt, MD Electronically signed, 02/05/18

## 2018-02-05 NOTE — Anesthesia Procedure Notes (Signed)
Anesthesia Regional Block: Supraclavicular block   Pre-Anesthetic Checklist: ,, timeout performed, Correct Patient, Correct Site, Correct Laterality, Correct Procedure, Correct Position, site marked, Risks and benefits discussed,  Surgical consent,  Pre-op evaluation,  At surgeon's request and post-op pain management  Laterality: Left and Upper  Prep: chloraprep       Needles:  Injection technique: Single-shot  Needle Type: Echogenic Stimulator Needle     Needle Length: 10cm  Needle Gauge: 21     Additional Needles:   Procedures:,,,, ultrasound used (permanent image in chart),,,,  Narrative:  Start time: 02/05/2018 11:27 AM End time: 02/05/2018 11:37 AM Injection made incrementally with aspirations every 5 mL.  Performed by: Personally  Anesthesiologist: Leilani AbleHatchett, Stephon Weathers, MD

## 2018-02-05 NOTE — Progress Notes (Signed)
Assisted Dr. Hatchett with left, ultrasound guided, supraclavicular block. Side rails up, monitors on throughout procedure. See vital signs in flow sheet. Tolerated Procedure well. 

## 2018-02-05 NOTE — H&P (Signed)
Leslie Wheeler is an 37 y.o. female.   Chief Complaint:left wrist pain [id number] is a 37 year old right-hand-dominant female referred by Dr. Manson Passey for consultation regarding an injury she sustained to her left wrist on a Powerflex machine while doing a workout 2 days ago. She complains of pain on the radial aspect with a VAS score 5-6/10 aching in nature with movement she was given a splint which has resolved the pain issue for her. She does have a prior history of a similar injury a year ago. She is presently in a thumb spica splint which is comfortable for her. She has been taking ibuprofen 600 mg which has helped along with a Medrol Dosepak. Localized the pain over the radial side of her left wrist. To lesser extent on the ulnar side.  She was referred for a arthrographic MRI which has been done and read out by Dr. Charise Killian. This reveals a indication between the proximal and midcarpal joints. The active area of communication was unable to be determined. She continues to complain of pain in her wrist VAS score of 5-6 over 10 with use despite wearing a splint. Has had conservative treatment without significant relief of symptoms. He sustained an injury on a PowerFlex machine approximately 4 months ago. Does have a prior history of injury to that thumb. She has no history of diabetes thyroid problems arthritis or gout. Family history is negative for each of these also.    Past Medical History:  Diagnosis Date  . Asthma   . Asthma   . GERD (gastroesophageal reflux disease)   . Rhinitis, allergic     Past Surgical History:  Procedure Laterality Date  . COLONOSCOPY    . SINOSCOPY      Family History  Problem Relation Age of Onset  . COPD Paternal Grandmother   . COPD Paternal Grandfather    Social History:  reports that she quit smoking about 18 years ago. Her smoking use included cigarettes. She has a 5.00 pack-year smoking history. She has never used smokeless tobacco. She reports that she  drinks alcohol. She reports that she does not use drugs.  Allergies:  Allergies  Allergen Reactions  . Aspirin   . Bactrim [Sulfamethoxazole-Trimethoprim]   . Cephalosporins   . Flagyl [Metronidazole]   . Latex   . Nsaids   . Penicillins   . Sulfa Antibiotics     No medications prior to admission.    No results found for this or any previous visit (from the past 48 hour(s)).  No results found.   Pertinent items are noted in HPI.  Height 5\' 2"  (1.575 m), weight 68 kg.  General appearance: alert, cooperative and appears stated age Head: Normocephalic, without obvious abnormality Neck: no JVD Resp: clear to auscultation bilaterally Cardio: regular rate and rhythm, S1, S2 normal, no murmur, click, rub or gallop GI: soft, non-tender; bowel sounds normal; no masses,  no organomegaly Extremities: left wrist pain Pulses: 2+ and symmetric Skin: Skin color, texture, turgor normal. No rashes or lesions Neurologic: Grossly normal Incision/Wound: na  Assessment/Plan Assessment:  1. 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 Pre-peri-and postoperative course are discussed along with risk applications. She is aware there is no guarantee to the surgery the possibility of infection recurrence injury to arteries nerves tendons complete relief symptoms dystrophy. She would like to proceed  she is scheduled for left wrist arthroscopy with debridement repair shrinkage as dictated by findings if there is significant injury to the scapholunate lunotriquetral joint we would stop and discuss treatment alternatives with her. She is in agreement with this approach.   Cindee SaltGary Siraj Dermody 02/05/2018, 9:08 AM

## 2018-02-05 NOTE — Discharge Instructions (Signed)
° °  ° ° ° °[facility] Center Instructions °Hand Surgery ° °Wound Care: °Keep your hand elevated above the level of your heart.  Do not allow it to dangle by your side.  Keep the dressing dry and do not remove it unless your doctor advises you to do so.  He will usually change it at the time of your post-op visit.  Moving your fingers is advised to stimulate circulation but will depend on the site of your surgery.  If you have a splint applied, your doctor will advise you regarding movement. ° °Activity: °Do not drive or operate machinery today.  Rest today and then you may return to your normal activity and work as indicated by your physician. ° °Diet:  °Drink liquids today or eat a light diet.  You may resume a regular diet tomorrow.   ° °General expectations: °Pain for two to three days. °Fingers may become slightly swollen. ° °Call your doctor if any of the following occur: °Severe pain not relieved by pain medication. °Elevated temperature. °Dressing soaked with blood. °Inability to move fingers. °White or bluish color to fingers. ° ° °Regional Anesthesia Blocks ° °1. Numbness or the inability to move the "blocked" extremity may last from 3-48 hours after placement. The length of time depends on the medication injected and your individual response to the medication. If the numbness is not going away after 48 hours, call your surgeon. ° °2. The extremity that is blocked will need to be protected until the numbness is gone and the  Strength has returned. Because you cannot feel it, you will need to take extra care to avoid injury. Because it may be weak, you may have difficulty moving it or using it. You may not know what position it is in without looking at it while the block is in effect. ° °3. For blocks in the legs and feet, returning to weight bearing and walking needs to be done carefully. You will need to wait until the numbness is entirely gone and the strength has returned. You should be able to move your leg  and foot normally before you try and bear weight or walk. You will need someone to be with you when you first try to ensure you do not fall and possibly risk injury. ° °4. Bruising and tenderness at the needle site are common side effects and will resolve in a few days. ° °5. Persistent numbness or new problems with movement should be communicated to the surgeon or the [facility] ([phone])/ [facility] ([phone]). ° ° ° °Post Anesthesia Home Care Instructions ° °Activity: °Get plenty of rest for the remainder of the day. A responsible individual must stay with you for 24 hours following the procedure.  °For the next 24 hours, DO NOT: °-Drive a car °-Operate machinery °-Drink alcoholic beverages °-Take any medication unless instructed by your physician °-Make any legal decisions or sign important papers. ° °Meals: °Start with liquid foods such as gelatin or soup. Progress to regular foods as tolerated. Avoid greasy, spicy, heavy foods. If nausea and/or vomiting occur, drink only clear liquids until the nausea and/or vomiting subsides. Call your physician if vomiting continues. ° °Special Instructions/Symptoms: °Your throat may feel dry or sore from the anesthesia or the breathing tube placed in your throat during surgery. If this causes discomfort, gargle with warm salt water. The discomfort should disappear within 24 hours. ° °If you had a scopolamine patch placed behind your ear for   the management of post- operative nausea and/or vomiting: ° °1. The medication in the patch is effective for 72 hours, after which it should be removed.  Wrap patch in a tissue and discard in the trash. Wash hands thoroughly with soap and water. °2. You may remove the patch earlier than 72 hours if you experience unpleasant side effects which may include dry mouth, dizziness or visual disturbances. °3. Avoid touching the patch. Wash your hands with soap and water after contact with the patch. °  ° ° °

## 2018-02-05 NOTE — Transfer of Care (Signed)
Immediate Anesthesia Transfer of Care Note  Patient: Leslie Wheeler  Procedure(s) Performed: ARTHROSCOPY LEFT WRIST (Left Wrist)  Patient Location: PACU  Anesthesia Type:MAC combined with regional for post-op pain  Level of Consciousness: awake, alert , oriented and patient cooperative  Airway & Oxygen Therapy: Patient Spontanous Breathing and Patient connected to face mask oxygen  Post-op Assessment: Report given to RN and Post -op Vital signs reviewed and stable  Post vital signs: Reviewed and stable  Last Vitals:  Vitals Value Taken Time  BP    Temp    Pulse 78 02/05/2018 12:59 PM  Resp 21 02/05/2018 12:59 PM  SpO2 100 % 02/05/2018 12:59 PM  Vitals shown include unvalidated device data.  Last Pain:  Vitals:   02/05/18 1116  TempSrc: Oral  PainSc: 0-No pain      Patients Stated Pain Goal: 0 (78/29/56 2130)  Complications: No apparent anesthesia complications

## 2018-02-05 NOTE — Brief Op Note (Signed)
02/05/2018  12:55 PM  PATIENT:  Leslie Wheeler  37 y.o. female  PRE-OPERATIVE DIAGNOSIS:  TRIANGULAR FIBROCARTILAGE COMPLEX TEAR LEFT WRIST  POST-OPERATIVE DIAGNOSIS:  TRIANGULAR FIBROCARTILAGE COMPLEX TEAR LEFT WRIST  PROCEDURE:  Procedure(s): ARTHROSCOPY LEFT WRIST (Left)  SURGEON:  Surgeon(s) and Role:    Cindee Salt* Jennifer Holland, MD - Primary  PHYSICIAN ASSISTANT:   ASSISTANTS: none   ANESTHESIA:   regional and general  EBL:  4ml  BLOOD ADMINISTERED:none  DRAINS: none   LOCAL MEDICATIONS USED:  NONE  SPECIMEN:  No Specimen  DISPOSITION OF SPECIMEN:  N/A  COUNTS:  YES  TOURNIQUET:  * Missing tourniquet times found for documented tourniquets in log: [phone] *  DICTATION: .Dragon Dictation  PLAN OF CARE: Discharge to home after PACU  PATIENT DISPOSITION:  PACU - hemodynamically stable.

## 2018-02-05 NOTE — Anesthesia Postprocedure Evaluation (Signed)
Anesthesia Post Note  Patient: Leslie Wheeler  Procedure(s) Performed: ARTHROSCOPY LEFT WRIST (Left Wrist)     Patient location during evaluation: PACU Anesthesia Type: MAC and Regional Pain management: pain level controlled Vital Signs Assessment: post-procedure vital signs reviewed and stable Respiratory status: spontaneous breathing Postop Assessment: no apparent nausea or vomiting Anesthetic complications: no    Last Vitals:  Vitals:   02/05/18 1325 02/05/18 1345  BP: 131/76 115/82  Pulse: 70 77  Resp: 14 18  Temp:  36.5 C  SpO2: 97% 100%    Last Pain:  Vitals:   02/05/18 1345  TempSrc:   PainSc: 0-No pain   Pain Goal: Patients Stated Pain Goal: 3 (02/05/18 1325)               Huston Foley

## 2018-02-05 NOTE — Anesthesia Preprocedure Evaluation (Addendum)
Anesthesia Evaluation  Patient identified by MRN, date of birth, ID band Patient awake    Reviewed: Allergy & Precautions, NPO status , Patient's Chart, lab work & pertinent test results  Airway Mallampati: I       Dental no notable dental hx. (+) Teeth Intact   Pulmonary former smoker,    Pulmonary exam normal breath sounds clear to auscultation       Cardiovascular negative cardio ROS Normal cardiovascular exam Rhythm:Regular Rate:Normal     Neuro/Psych negative neurological ROS  negative psych ROS   GI/Hepatic Neg liver ROS, GERD  Medicated and Controlled,  Endo/Other  negative endocrine ROS  Renal/GU negative Renal ROS  negative genitourinary   Musculoskeletal negative musculoskeletal ROS (+)   Abdominal Normal abdominal exam  (+)   Peds  Hematology negative hematology ROS (+)   Anesthesia Other Findings   Reproductive/Obstetrics                             Anesthesia Physical Anesthesia Plan  ASA: II  Anesthesia Plan: MAC and Regional   Post-op Pain Management:  Regional for Post-op pain   Induction:   PONV Risk Score and Plan: 2 and Ondansetron and Dexamethasone  Airway Management Planned: Natural Airway and Nasal Cannula  Additional Equipment:   Intra-op Plan:   Post-operative Plan:   Informed Consent: I have reviewed the patients History and Physical, chart, labs and discussed the procedure including the risks, benefits and alternatives for the proposed anesthesia with the patient or authorized representative who has indicated his/her understanding and acceptance.     Plan Discussed with: CRNA and Surgeon  Anesthesia Plan Comments:        Anesthesia Quick Evaluation

## 2018-02-06 ENCOUNTER — Encounter (HOSPITAL_BASED_OUTPATIENT_CLINIC_OR_DEPARTMENT_OTHER): Payer: Self-pay | Admitting: Orthopedic Surgery

## 2018-04-11 DIAGNOSIS — J4520 Mild intermittent asthma, uncomplicated: Secondary | ICD-10-CM | POA: Diagnosis not present

## 2018-05-16 DIAGNOSIS — J4520 Mild intermittent asthma, uncomplicated: Secondary | ICD-10-CM | POA: Diagnosis not present

## 2018-06-27 DIAGNOSIS — M545 Low back pain: Secondary | ICD-10-CM | POA: Diagnosis not present

## 2019-11-22 ENCOUNTER — Telehealth: Payer: Self-pay | Admitting: Oncology

## 2019-11-22 ENCOUNTER — Other Ambulatory Visit: Payer: Self-pay | Admitting: Critical Care Medicine

## 2019-11-22 DIAGNOSIS — J4551 Severe persistent asthma with (acute) exacerbation: Secondary | ICD-10-CM

## 2019-11-22 DIAGNOSIS — U071 COVID-19: Secondary | ICD-10-CM

## 2019-11-22 NOTE — Progress Notes (Signed)
I connected by phone with Leslie Wheeler on 11/22/2019 at 2:40 PM to discuss the potential use of a new treatment for mild to moderate COVID-19 viral infection in non-hospitalized patients.  This patient is a 39 y.o. female that meets the FDA criteria for Emergency Use Authorization of COVID monoclonal antibody casirivimab/imdevimab.  Has a (+) direct SARS-CoV-2 viral test result  Has mild or moderate COVID-19   Is NOT hospitalized due to COVID-19  Is within 10 days of symptom onset  Has at least one of the high risk factor(s) for progression to severe COVID-19 and/or hospitalization as defined in EUA.  Specific high risk criteria : Chronic Lung Disease   I have spoken and communicated the following to the patient or parent/caregiver regarding COVID monoclonal antibody treatment:  1. FDA has authorized the emergency use for the treatment of mild to moderate COVID-19 in adults and pediatric patients with positive results of direct SARS-CoV-2 viral testing who are 56 years of age and older weighing at least 40 kg, and who are at high risk for progressing to severe COVID-19 and/or hospitalization.  2. The significant known and potential risks and benefits of COVID monoclonal antibody, and the extent to which such potential risks and benefits are unknown.  3. Information on available alternative treatments and the risks and benefits of those alternatives, including clinical trials.  4. Patients treated with COVID monoclonal antibody should continue to self-isolate and use infection control measures (e.g., wear mask, isolate, social distance, avoid sharing personal items, clean and disinfect "high touch" surfaces, and frequent handwashing) according to CDC guidelines.   5. The patient or parent/caregiver has the option to accept or refuse COVID monoclonal antibody treatment.  After reviewing this information with the patient, The patient agreed to proceed with receiving  casirivimab\imdevimab infusion and will be provided a copy of the Fact sheet prior to receiving the infusion. [name] 11/22/2019 2:40 PM

## 2019-11-22 NOTE — Telephone Encounter (Signed)
Error

## 2019-11-23 ENCOUNTER — Ambulatory Visit (HOSPITAL_COMMUNITY)
Admission: RE | Admit: 2019-11-23 | Discharge: 2019-11-23 | Disposition: A | Discharge status: Home / Self Care | Payer: 59 | Source: Ambulatory Visit | Attending: Pulmonary Disease | Admitting: Pulmonary Disease

## 2019-11-23 ENCOUNTER — Other Ambulatory Visit (HOSPITAL_COMMUNITY): Payer: Self-pay

## 2019-11-23 DIAGNOSIS — J4551 Severe persistent asthma with (acute) exacerbation: Secondary | ICD-10-CM | POA: Diagnosis not present

## 2019-11-23 DIAGNOSIS — U071 COVID-19: Secondary | ICD-10-CM | POA: Insufficient documentation

## 2019-11-23 MED ORDER — DIPHENHYDRAMINE HCL 50 MG/ML IJ SOLN: 50.0000 mg | Freq: Once | INTRAMUSCULAR | Status: DC | PRN

## 2019-11-23 MED ORDER — EPINEPHRINE 0.3 MG/0.3ML IJ SOAJ: 0.3000 mg | Freq: Once | INTRAMUSCULAR | Status: DC | PRN

## 2019-11-23 MED ORDER — ALBUTEROL SULFATE HFA 108 (90 BASE) MCG/ACT IN AERS: 2.0000 | INHALATION_SPRAY | Freq: Once | RESPIRATORY_TRACT | Status: DC | PRN

## 2019-11-23 MED ORDER — SODIUM CHLORIDE 0.9 % IV SOLN
1200.0000 mg | Freq: Once | INTRAVENOUS | Status: AC
  Administered 2019-11-23: 1200 mg via INTRAVENOUS
  Filled 2019-11-23: qty 10

## 2019-11-23 MED ORDER — SODIUM CHLORIDE 0.9 % IV SOLN: INTRAVENOUS | Status: DC | PRN

## 2019-11-23 MED ORDER — FAMOTIDINE IN NACL 20-0.9 MG/50ML-% IV SOLN: 20.0000 mg | Freq: Once | INTRAVENOUS | Status: DC | PRN

## 2019-11-23 MED ORDER — METHYLPREDNISOLONE SODIUM SUCC 125 MG IJ SOLR: 125.0000 mg | Freq: Once | INTRAMUSCULAR | Status: DC | PRN

## 2019-11-23 NOTE — Discharge Instructions (Signed)

## 2019-11-23 NOTE — Progress Notes (Signed)
  Diagnosis: COVID-19  Physician: Wright, MD  Procedure: [facility] Med: casirivimab\imdevimab infusion - Provided patient with casirivimab\imdevimab fact sheet for patients, parents and caregivers prior to infusion.  Complications: No immediate complications noted.  Discharge: Discharged home   Krzysztof Reichelt R Miklos Bidinger 11/23/2019   

## 2023-06-16 ENCOUNTER — Encounter (HOSPITAL_BASED_OUTPATIENT_CLINIC_OR_DEPARTMENT_OTHER): Payer: Self-pay

## 2023-06-16 ENCOUNTER — Ambulatory Visit (HOSPITAL_BASED_OUTPATIENT_CLINIC_OR_DEPARTMENT_OTHER)
Admission: RE | Admit: 2023-06-16 | Discharge: 2023-06-16 | Disposition: A | Discharge status: Home / Self Care | Payer: Self-pay | Source: Ambulatory Visit | Attending: Family Medicine | Admitting: Family Medicine

## 2023-06-16 VITALS — BP 145/97 | HR 102 | Temp 98.4°F | Resp 20

## 2023-06-16 DIAGNOSIS — J4521 Mild intermittent asthma with (acute) exacerbation: Secondary | ICD-10-CM | POA: Diagnosis not present

## 2023-06-16 MED ORDER — BENZONATATE 100 MG PO CAPS: 100.0000 mg | ORAL_CAPSULE | Freq: Three times a day (TID) | ORAL | 0 refills | Status: AC | PRN

## 2023-06-16 MED ORDER — PREDNISONE 20 MG PO TABS: 40.0000 mg | ORAL_TABLET | Freq: Every day | ORAL | 0 refills | Status: AC

## 2023-06-16 MED ORDER — FLUTICASONE-SALMETEROL 115-21 MCG/ACT IN AERO: 2.0000 | INHALATION_SPRAY | Freq: Two times a day (BID) | RESPIRATORY_TRACT | 0 refills | Status: AC

## 2023-06-16 NOTE — ED Provider Notes (Signed)
 [facility]    CSN: [id number] Arrival date & time: 06/16/23  0848      History   Chief Complaint Chief Complaint  Patient presents with   Wheezing   Cough    HPI Leslie Wheeler is a 43 y.o. female.    Wheezing Associated symptoms: cough   Cough Associated symptoms: wheezing   Here for wheezing and cough.  About 1 week ago she cleaned out her barn and was exposed to a lot of hay and dust and the next day she started having increased wheezing.  She actually wheezes more when she is up and moving around and she did wheeze a good bit last night when lying down.  She has not had any fever or nasal congestion or rhinorrhea.  Her throat is not hurting.  She is hoarse, but feels that is due to the coughing she has been doing.  Her albuterol inhaler is giving her some minimal relief.  She does report that previously adding on Advair has helped a lot when she has trouble with seasonal allergies and asthma. No vomiting or diarrhea.  She is allergic to Flagyl, sulfa, cephalosporins, and penicillin.  Last menstrual cycle was about 15 years ago.  She has had an IUD for the last 15 years.   Past Medical History:  Diagnosis Date   Asthma    Asthma    GERD (gastroesophageal reflux disease)    Rhinitis, allergic     Patient Active Problem List   Diagnosis Date Noted   Asthma 11/19/2014   Allergic rhinitis 11/19/2014   GERD (gastroesophageal reflux disease) 11/19/2014    Past Surgical History:  Procedure Laterality Date   COLONOSCOPY     SINOSCOPY     WRIST ARTHROSCOPY Left 02/05/2018   Procedure: ARTHROSCOPY LEFT WRIST;  Surgeon: Cindee Salt, MD;  Location: [facility];  Service: Orthopedics;  Laterality: Left;    OB History   No obstetric history on file.      Home Medications    Prior to Admission medications   Medication Sig Start Date End Date Taking? Authorizing Provider  albuterol (PROVENTIL HFA;VENTOLIN HFA) 108 (90 BASE) MCG/ACT  inhaler Inhale 2 puffs into the lungs every 6 (six) hours as needed for wheezing or shortness of breath.   Yes [provider]  benzonatate (TESSALON) 100 MG capsule Take 1 capsule (100 mg total) by mouth 3 (three) times daily as needed for cough. 06/16/23  Yes Zenia Resides, MD  Cetirizine HCl 10 MG CAPS  12/16/22  Yes [provider]  ELDERBERRY PO Take by mouth. 11/02/17  Yes [provider]  famotidine (PEPCID) 20 MG tablet Take 20 mg by mouth daily. 01/23/23  Yes [provider]  fluticasone (FLONASE) 50 MCG/ACT nasal spray Place 1 spray into both nostrils 2 (two) times daily.   Yes [provider]  montelukast (SINGULAIR) 10 MG tablet Take 1 tablet (10 mg total) by mouth daily. 05/07/15  Yes Kozlow, Alvira Philips, MD  Multiple Vitamins-Minerals (WOMENS MULTIVITAMIN PO) Take by mouth daily.   Yes [provider]  omeprazole (PRILOSEC) 20 MG capsule Take 20 mg by mouth daily.   Yes [provider]  predniSONE (DELTASONE) 20 MG tablet Take 2 tablets (40 mg total) by mouth daily with breakfast for 5 days. 06/16/23 06/21/23 Yes Zarielle Cea, Janace Aris, MD  Turmeric 500 MG CAPS    Yes [provider]  cholecalciferol (VITAMIN D3) 25 MCG (1000 UNIT) tablet Take 1,000  Units by mouth daily.    [provider]  ELDERBERRY PO Take by mouth.    [provider]  fluticasone-salmeterol (ADVAIR HFA) 115-21 MCG/ACT inhaler Inhale 2 puffs into the lungs 2 (two) times daily. 06/16/23   Zenia Resides, MD  HYDROcodone-acetaminophen (NORCO) 5-325 MG tablet Take 1 tablet by mouth every 6 (six) hours as needed. 02/05/18   Cindee Salt, MD  loratadine (CLARITIN) 10 MG tablet Take 10 mg by mouth daily as needed for allergies.    [provider]  Multiple Vitamins-Minerals (ONE-A-DAY WOMENS PO)     [provider]  ranitidine (ZANTAC) 150 MG tablet Take 150 mg by mouth 2 (two) times daily.    [provider]     Family History Family History  Problem Relation Age of Onset   COPD Paternal Grandmother    COPD Paternal Grandfather     Social History Social History   Tobacco Use   Smoking status: Former    Current packs/day: 0.00    Average packs/day: 1 pack/day for 5.0 years (5.0 ttl pk-yrs)    Types: Cigarettes    Start date: 03/13/1994    Quit date: 03/14/1999    Years since quitting: 24.2   Smokeless tobacco: Never  Substance Use Topics   Alcohol use: Yes    Alcohol/week: 0.0 standard drinks of alcohol    Comment: occasional   Drug use: No     Allergies   Flagyl [metronidazole], Aspirin, Bactrim [sulfamethoxazole-trimethoprim], Cephalosporins, Latex, Penicillins, and Sulfa antibiotics   Review of Systems Review of Systems  Respiratory:  Positive for cough and wheezing.      Physical Exam Triage Vital Signs ED Triage Vitals  Encounter Vitals Group     BP 06/16/23 0859 (!) 145/97     Systolic BP Percentile --      Diastolic BP Percentile --      Pulse Rate 06/16/23 0859 (!) 102     Resp 06/16/23 0859 20     Temp 06/16/23 0859 98.4 F (36.9 C)     Temp Source 06/16/23 0859 Oral     SpO2 06/16/23 0859 95 %     Weight --      Height --      Head Circumference --      Peak Flow --      Pain Score 06/16/23 0855 0     Pain Loc --      Pain Education --      Exclude from Growth Chart --    No data found.  Updated Vital Signs BP (!) 145/97 (BP Location: Right Arm)   Pulse (!) 102   Temp 98.4 F (36.9 C) (Oral)   Resp 20   SpO2 95%   Visual Acuity Right Eye Distance:   Left Eye Distance:   Bilateral Distance:    Right Eye Near:   Left Eye Near:    Bilateral Near:     Physical Exam Vitals reviewed.  Constitutional:      General: She is not in acute distress.    Appearance: She is not ill-appearing, toxic-appearing or diaphoretic.  HENT:     Nose: Nose normal.     Mouth/Throat:     Mouth: Mucous membranes are moist.     Comments: No tonsillar  hypertrophy.  There is some very mild erythema posterior oropharynx. Eyes:     Extraocular Movements: Extraocular movements intact.     Conjunctiva/sclera: Conjunctivae normal.     Pupils: Pupils are  equal, round, and reactive to light.  Cardiovascular:     Rate and Rhythm: Normal rate and regular rhythm.     Heart sounds: No murmur heard. Pulmonary:     Effort: No respiratory distress.     Breath sounds: No stridor. No rhonchi or rales.     Comments: Expiratory phase is a little diminished.  There are wheezes when she coughs.  Air movement is fairly good Musculoskeletal:     Cervical back: Neck supple.  Lymphadenopathy:     Cervical: No cervical adenopathy.  Skin:    Capillary Refill: Capillary refill takes less than 2 seconds.     Coloration: Skin is not jaundiced or pale.  Neurological:     General: No focal deficit present.     Mental Status: She is alert and oriented to person, place, and time.  Psychiatric:        Behavior: Behavior normal.      UC Treatments / Results  Labs (all labs ordered are listed, but only abnormal results are displayed) Labs Reviewed - No data to display  EKG   Radiology No results found.  Procedures Procedures (including critical care time)  Medications Ordered in UC Medications - No data to display  Initial Impression / Assessment and Plan / UC Course  I have reviewed the triage vital signs and the nursing notes.  Pertinent labs & imaging results that were available during my care of the patient were reviewed by me and considered in my medical decision making (see chart for details).     5-day burst of prednisone is sent in for her asthma exacerbation and Tessalon Perles are sent in for the cough.  I think using Advair routinely for the next month makes sense.  That is sent also.  She can follow-up with her primary care  Final Clinical Impressions(s) / UC Diagnoses   Final diagnoses:  Mild intermittent asthma with acute  exacerbation     Discharge Instructions      Take prednisone 20 mg--2 daily for 5 days  Take benzonatate 100 mg, 1 tab every 8 hours as needed for cough.  Advair 115-36mcg inhaler-2 puffs 2 times daily.  Rinse your mouth after use  Continue using your albuterol you have at home as needed     ED Prescriptions     Medication Sig Dispense Auth. Provider   fluticasone-salmeterol (ADVAIR HFA) 115-21 MCG/ACT inhaler Inhale 2 puffs into the lungs 2 (two) times daily. 1 each Zenia Resides, MD   predniSONE (DELTASONE) 20 MG tablet Take 2 tablets (40 mg total) by mouth daily with breakfast for 5 days. 10 tablet Zenia Resides, MD   benzonatate (TESSALON) 100 MG capsule Take 1 capsule (100 mg total) by mouth 3 (three) times daily as needed for cough. 21 capsule Zenia Resides, MD      PDMP not reviewed this encounter.   Zenia Resides, MD 06/16/23 [phone]

## 2023-06-16 NOTE — ED Triage Notes (Signed)
 Pt has been wheezing for couple days, she has used her inhaler but no relieve.

## 2023-06-16 NOTE — Discharge Instructions (Signed)
 Take prednisone 20 mg--2 daily for 5 days  Take benzonatate 100 mg, 1 tab every 8 hours as needed for cough.  Advair 115-29mcg inhaler-2 puffs 2 times daily.  Rinse your mouth after use  Continue using your albuterol you have at home as needed

## 2023-11-05 ENCOUNTER — Encounter (HOSPITAL_BASED_OUTPATIENT_CLINIC_OR_DEPARTMENT_OTHER): Payer: Self-pay

## 2023-11-05 ENCOUNTER — Ambulatory Visit (HOSPITAL_BASED_OUTPATIENT_CLINIC_OR_DEPARTMENT_OTHER)
Admission: RE | Admit: 2023-11-05 | Discharge: 2023-11-05 | Disposition: A | Discharge status: Home / Self Care | Source: Ambulatory Visit | Attending: Family Medicine | Admitting: Family Medicine

## 2023-11-05 VITALS — BP 131/89 | HR 105 | Temp 98.4°F | Resp 20

## 2023-11-05 DIAGNOSIS — J029 Acute pharyngitis, unspecified: Secondary | ICD-10-CM | POA: Diagnosis not present

## 2023-11-05 DIAGNOSIS — B084 Enteroviral vesicular stomatitis with exanthem: Secondary | ICD-10-CM

## 2023-11-05 LAB — POCT RAPID STREP A (OFFICE): Rapid Strep A Screen: NEGATIVE

## 2023-11-05 NOTE — Discharge Instructions (Signed)
 Your strep test was negative.  This is most likely something viral.  Looks like you have hand-foot-and-mouth.  Recommend over-the-counter medications for symptoms as needed and follow-up as needed

## 2023-11-05 NOTE — ED Provider Notes (Signed)
 [facility]    CSN: [id number] Arrival date & time: 11/05/23  0813      History   Chief Complaint Chief Complaint  Patient presents with   Rash    Recent fever, sore throat, rash with blisters on hands, itching feet, sore in nostril - Entered by patient    HPI Leslie Wheeler is a 43 y.o. female.   Pt is a 43 year old female that presents with rash and sore throat. Thursday night patient started having sore throat.  Friday night spiked a fever chills. Saturday noticed white patches on tonsils. Sunday started feeling better as far as aches and fever. Alternated motrin and tylenol  but hasn't taken Motrin since yesterday (Sunday). Noticed rash developing on hands and bottom of feet, up legs up to groin and palms of hands on Sunday. Did not notice any sores in mouth other than patches on tonsils. Reports that hands and feet sores are painful but no itching.   Rash   Past Medical History:  Diagnosis Date   Asthma    Asthma    GERD (gastroesophageal reflux disease)    Rhinitis, allergic     Patient Active Problem List   Diagnosis Date Noted   Asthma 11/19/2014   Allergic rhinitis 11/19/2014   GERD (gastroesophageal reflux disease) 11/19/2014    Past Surgical History:  Procedure Laterality Date   COLONOSCOPY     SINOSCOPY     WRIST ARTHROSCOPY Left 02/05/2018   Procedure: ARTHROSCOPY LEFT WRIST;  Surgeon: Murrell Kuba, MD;  [facility];  Service: Orthopedics;  Laterality: Left;    OB History   No obstetric history on file.      Home Medications    Prior to Admission medications   Medication Sig Start Date End Date Taking? Authorizing Provider  albuterol  (PROVENTIL  HFA;VENTOLIN  HFA) 108 (90 BASE) MCG/ACT inhaler Inhale 2 puffs into the lungs every 6 (six) hours as needed for wheezing or shortness of breath.    [provider]  benzonatate  (TESSALON ) 100 MG capsule Take 1 capsule (100 mg total) by mouth 3 (three) times  daily as needed for cough. 06/16/23   Banister, Pamela K, MD  Cetirizine HCl 10 MG CAPS  12/16/22   [provider]  cholecalciferol (VITAMIN D3) 25 MCG (1000 UNIT) tablet Take 1,000 Units by mouth daily.    [provider]  ELDERBERRY PO Take by mouth.    [provider]  ELDERBERRY PO Take by mouth. 11/02/17   [provider]  famotidine  (PEPCID ) 20 MG tablet Take 20 mg by mouth daily. 01/23/23   [provider]  fluticasone  (FLONASE) 50 MCG/ACT nasal spray Place 1 spray into both nostrils 2 (two) times daily.    [provider]  fluticasone -salmeterol (ADVAIR HFA) 115-21 MCG/ACT inhaler Inhale 2 puffs into the lungs 2 (two) times daily. 06/16/23   Banister, Pamela K, MD  HYDROcodone -acetaminophen  (NORCO) 5-325 MG tablet Take 1 tablet by mouth every 6 (six) hours as needed. 02/05/18   Murrell Kuba, MD  loratadine (CLARITIN) 10 MG tablet Take 10 mg by mouth daily as needed for allergies.    [provider]  montelukast  (SINGULAIR ) 10 MG tablet Take 1 tablet (10 mg total) by mouth daily. 05/07/15   Kozlow, Camellia PARAS, MD  Multiple Vitamins-Minerals (ONE-A-DAY WOMENS PO)     [provider]  Multiple Vitamins-Minerals (WOMENS MULTIVITAMIN PO) Take by mouth daily.    [provider]  omeprazole (PRILOSEC) 20 MG  capsule Take 20 mg by mouth daily.    [provider]  ranitidine (ZANTAC) 150 MG tablet Take 150 mg by mouth 2 (two) times daily.    [provider]  Turmeric 500 MG CAPS     [provider]    Family History Family History  Problem Relation Age of Onset   COPD Paternal Grandmother    COPD Paternal Grandfather     Social History Social History   Tobacco Use   Smoking status: Former    Current packs/day: 0.00    Average packs/day: 1 pack/day for 5.0 years (5.0 ttl pk-yrs)    Types: Cigarettes    Start date: 03/13/1994    Quit date: 03/14/1999    Years since quitting: 24.6   Smokeless  tobacco: Never  Substance Use Topics   Alcohol use: Yes    Alcohol/week: 0.0 standard drinks of alcohol    Comment: occasional   Drug use: No     Allergies   Flagyl [metronidazole], Aspirin, Bactrim [sulfamethoxazole-trimethoprim], Cephalosporins, Latex, Penicillins, and Sulfa antibiotics   Review of Systems Review of Systems  Skin:  Positive for rash.   See HPI  Physical Exam Triage Vital Signs ED Triage Vitals  Encounter Vitals Group     BP 11/05/23 0826 131/89     Girls Systolic BP Percentile --      Girls Diastolic BP Percentile --      Boys Systolic BP Percentile --      Boys Diastolic BP Percentile --      Pulse Rate 11/05/23 0826 (!) 105     Resp 11/05/23 0826 20     Temp 11/05/23 0826 98.4 F (36.9 C)     Temp Source 11/05/23 0826 Oral     SpO2 11/05/23 0826 96 %     Weight --      Height --      Head Circumference --      Peak Flow --      Pain Score 11/05/23 0829 1     Pain Loc --      Pain Education --      Exclude from Growth Chart --    No data found.  Updated Vital Signs BP 131/89 (BP Location: Right Arm)   Pulse (!) 105   Temp 98.4 F (36.9 C) (Oral)   Resp 20   SpO2 96%   Visual Acuity Right Eye Distance:   Left Eye Distance:   Bilateral Distance:    Right Eye Near:   Left Eye Near:    Bilateral Near:     Physical Exam Vitals and nursing note reviewed.  Constitutional:      General: She is not in acute distress.    Appearance: Normal appearance. She is not ill-appearing, toxic-appearing or diaphoretic.  HENT:     Mouth/Throat:     Pharynx: Uvula midline. Oropharyngeal exudate and posterior oropharyngeal erythema present.  Pulmonary:     Effort: Pulmonary effort is normal.  Skin:    General: Skin is warm and dry.     Findings: Rash present.     Comments: Lesions noted to palms of hands and feet Erythematous papular rash to bilateral lower extremities  Neurological:     Mental Status: She is alert.  Psychiatric:        Mood  and Affect: Mood normal.      UC Treatments / Results  Labs (all labs ordered are listed, but only abnormal results are displayed) Labs Reviewed  POCT RAPID STREP A (OFFICE) - Normal    EKG   Radiology No results found.  Procedures Procedures (including critical care time)  Medications Ordered in UC Medications - No data to display  Initial Impression / Assessment and Plan / UC Course  I have reviewed the triage vital signs and the nursing notes.  Pertinent labs & imaging results that were available during my care of the patient were reviewed by me and considered in my medical decision making (see chart for details).     Sore throat-most likely viral.  Rapid strep test negative.  Recommend over-the-counter medications as needed for symptoms. Believe she has hand-foot-and-mouth disease..  Same treatment as previous. Follow-up as needed Work note provided Final Clinical Impressions(s) / UC Diagnoses   Final diagnoses:  Sore throat  Hand, foot and mouth disease     Discharge Instructions      Your strep test was negative.  This is most likely something viral.  Looks like you have hand-foot-and-mouth.  Recommend over-the-counter medications for symptoms as needed and follow-up as needed   ED Prescriptions   None    PDMP not reviewed this encounter.   Adah Wilbert LABOR, FNP 11/05/23 [phone]

## 2023-11-05 NOTE — ED Triage Notes (Signed)
 Thursday night patient started having sore throat, Friday night spiked a fever chills, Saturday noticed white patches on tonisils, Sunday started feeling better as far as aches and fever. Alternated motrin and tylenol  but hasn't taken Motrin since yesterday (Sunday). Noticed rash developing on hands and bottom of feet, up legs up to groin and palms of hands on Sunday. Did not notice any sores in mouth other than patches on tonsils. Reports that hands and feet sores are painful but no itching.
